# Patient Record
Sex: Female | Born: 1965 | Race: White | Hispanic: No | Marital: Married | Smoking: Former smoker
Health system: Southern US, Community
[De-identification: ages and names within clinical notes are randomized; demographics above are authoritative.]

## PROBLEM LIST (undated history)

## (undated) DIAGNOSIS — F419 Anxiety disorder, unspecified: Secondary | ICD-10-CM

## (undated) HISTORY — DX: Anxiety disorder, unspecified: F41.9

---

## 2015-12-01 ENCOUNTER — Ambulatory Visit (INDEPENDENT_AMBULATORY_CARE_PROVIDER_SITE_OTHER): Payer: BLUE CROSS/BLUE SHIELD | Admitting: Physician Assistant

## 2015-12-01 ENCOUNTER — Encounter: Payer: Self-pay | Admitting: Physician Assistant

## 2015-12-01 VITALS — BP 123/84 | HR 81 | Temp 98.2°F | Ht 66.0 in | Wt 185.0 lb

## 2015-12-01 DIAGNOSIS — F329 Major depressive disorder, single episode, unspecified: Secondary | ICD-10-CM

## 2015-12-01 DIAGNOSIS — R635 Abnormal weight gain: Secondary | ICD-10-CM

## 2015-12-01 DIAGNOSIS — F32A Depression, unspecified: Secondary | ICD-10-CM

## 2015-12-01 DIAGNOSIS — G4700 Insomnia, unspecified: Secondary | ICD-10-CM | POA: Diagnosis not present

## 2015-12-01 DIAGNOSIS — F419 Anxiety disorder, unspecified: Secondary | ICD-10-CM

## 2015-12-01 DIAGNOSIS — E663 Overweight: Secondary | ICD-10-CM

## 2015-12-01 MED ORDER — TRAZODONE HCL 50 MG PO TABS: 25.0000 mg | ORAL_TABLET | Freq: Every evening | ORAL | Status: DC | PRN

## 2015-12-01 MED ORDER — LORAZEPAM 0.5 MG PO TABS: ORAL_TABLET | ORAL | Status: DC

## 2015-12-01 MED ORDER — PHENTERMINE HCL 37.5 MG PO TABS: 37.5000 mg | ORAL_TABLET | Freq: Every day | ORAL | Status: DC

## 2015-12-01 MED ORDER — ESCITALOPRAM OXALATE 20 MG PO TABS: 20.0000 mg | ORAL_TABLET | Freq: Every day | ORAL | Status: DC

## 2015-12-02 DIAGNOSIS — F32A Depression, unspecified: Secondary | ICD-10-CM | POA: Insufficient documentation

## 2015-12-02 DIAGNOSIS — G4700 Insomnia, unspecified: Secondary | ICD-10-CM | POA: Insufficient documentation

## 2015-12-02 DIAGNOSIS — E663 Overweight: Secondary | ICD-10-CM | POA: Insufficient documentation

## 2015-12-02 DIAGNOSIS — F329 Major depressive disorder, single episode, unspecified: Secondary | ICD-10-CM | POA: Insufficient documentation

## 2015-12-02 DIAGNOSIS — F419 Anxiety disorder, unspecified: Secondary | ICD-10-CM | POA: Insufficient documentation

## 2015-12-02 NOTE — Progress Notes (Signed)
   Subjective:    Patient ID: Tonya Malone, female    DOB: [birth year], 50 y.o.   MRN: [id number]  HPI Pt is a 50 yo female who presents to the clinic to establish care.   .. Active Ambulatory Problems    Diagnosis Date Noted  . Overweight 12/02/2015  . Depression 12/02/2015  . Insomnia 12/02/2015  . Anxiety 12/02/2015   Resolved Ambulatory Problems    Diagnosis Date Noted  . No Resolved Ambulatory Problems   No Additional Past Medical History   .[name] Family History  Problem Relation Age of Onset  . Cancer Mother   . Cancer Maternal Grandmother    .[name] Social History   Social History  . Marital Status: Married    Spouse Name: N/A  . Number of Children: N/A  . Years of Education: N/A   Occupational History  . Not on file.   Social History Main Topics  . Smoking status: Current Some Day Smoker    Types: Cigarettes  . Smokeless tobacco: Not on file  . Alcohol Use: 0.0 oz/week    0 Standard drinks or equivalent per week  . Drug Use: No  . Sexual Activity: Yes   Other Topics Concern  . Not on file   Social History Narrative  . No narrative on file   She would like refills on medication. She was previously on lexapro  but since the death of her 76 yo daughter from drug overdose she increased to  and doing better. She occasionally uses ativan but lately using more. She is going through grief counseling for 12 weeks. She is exercing regularly but not losing any weight. She is also frustrated with that. Ativan helps her sleep as well. No sucidial thoughts.    Review of Systems  All other systems reviewed and are negative.      Objective:   Physical Exam  Constitutional: She is oriented to person, place, and time. She appears well-developed and well-nourished.  Overweight.   HENT:  Head: Normocephalic and atraumatic.  Cardiovascular: Normal rate, regular rhythm and normal heart sounds.   Pulmonary/Chest: Effort normal and breath sounds normal. She has  no wheezes.  Neurological: She is alert and oriented to person, place, and time.  Skin: Skin is dry.  Psychiatric: She has a normal mood and affect. Her behavior is normal.          Assessment & Plan:  Depression/anxiety- refilled  of lexapro. Ativan refilled. Continue Grief counseling.   Insomnia- trazodone at bedtime. SE's discussed.   Overweight/abnormal weight gain- started phentermine. Discussed side effects. Discussed to stop if making insomnia worse. Follow up in 1 month. Diet and exercise discussed.

## 2015-12-31 ENCOUNTER — Ambulatory Visit (INDEPENDENT_AMBULATORY_CARE_PROVIDER_SITE_OTHER): Payer: BLUE CROSS/BLUE SHIELD | Admitting: Physician Assistant

## 2015-12-31 ENCOUNTER — Encounter: Payer: Self-pay | Admitting: Physician Assistant

## 2015-12-31 VITALS — BP 136/84 | HR 88 | Ht 66.0 in | Wt 173.0 lb

## 2015-12-31 DIAGNOSIS — R635 Abnormal weight gain: Secondary | ICD-10-CM

## 2015-12-31 DIAGNOSIS — G4700 Insomnia, unspecified: Secondary | ICD-10-CM | POA: Diagnosis not present

## 2015-12-31 DIAGNOSIS — F32A Depression, unspecified: Secondary | ICD-10-CM

## 2015-12-31 DIAGNOSIS — F329 Major depressive disorder, single episode, unspecified: Secondary | ICD-10-CM

## 2015-12-31 MED ORDER — PHENTERMINE HCL 37.5 MG PO TABS: 37.5000 mg | ORAL_TABLET | Freq: Every day | ORAL | Status: DC

## 2015-12-31 MED ORDER — ESCITALOPRAM OXALATE 20 MG PO TABS: 20.0000 mg | ORAL_TABLET | Freq: Every day | ORAL | Status: DC

## 2015-12-31 NOTE — Progress Notes (Addendum)
   Subjective:    Patient ID: Tonya Malone, female    DOB: 10/25/1966, 50 y.o.   MRN: [id number]  HPI  Patient is a 50 year old female presenting to follow up for depression, sleep disturbance, weight loss and anxiety. Patient states that she is tolerating her medications well and does not want to make changes at this time. Patient has lost 12 pounds since 12/01/15 with phentermine use. Following the untimely death of her daughter, patient is attending grief counseling and taking her medications for anxiety and depression as directed. Patient states that she would like a refill of Lexapro so she does "not want to run out". Patient states that she has been trying to stay busy by going to the gym with friends and family. Patient denies suicidal/homicidal ideation.  She is sleeping great with trazodone.   Review of Systems   Please see HPI.  Objective:   Physical Exam  Constitutional: She is oriented to person, place, and time. She appears well-developed and well-nourished. No distress.  HENT:  Head: Normocephalic and atraumatic.  Nose: Nose normal.  Mouth/Throat: Oropharynx is clear and moist.  Patient's right external auditory canal is erythematous with injection along the tympanic membrane. There is a positive light reflex and no evidence of pus.   Patient's left tympanic membrane is pearly without evidence of pus.   Eyes: Conjunctivae and EOM are normal. Pupils are equal, round, and reactive to light.  Neck: Normal range of motion.  Cardiovascular: Normal rate, regular rhythm, normal heart sounds and intact distal pulses.  Exam reveals no friction rub.   No murmur heard. Pulmonary/Chest: Effort normal and breath sounds normal. No respiratory distress. She has no wheezes. She has no rales. She exhibits no tenderness.  Abdominal: Soft. Bowel sounds are normal. She exhibits no distension and no mass. There is no tenderness. There is no rebound and no guarding.  Musculoskeletal: Normal range  of motion.  Neurological: She is alert and oriented to person, place, and time.  Skin: Skin is warm and dry. She is not diaphoretic.  Psychiatric: She has a normal mood and affect. Her behavior is normal. Judgment and thought content normal.      Assessment & Plan:   1. Abnormal Weight Gain Patient will continue taking phentermine. Discussed diet and exercise. Follow up nurse visit in 1 month.   2. Depression/Anxiety Patient's Lexapro was refilled. Patient will continue taking Ativan as needed for anxiety. Doing great.   3. Sleep disturbance Patient will continue taking Trazodone. Doing well.

## 2016-01-01 ENCOUNTER — Other Ambulatory Visit: Payer: Self-pay | Admitting: Physician Assistant

## 2016-01-19 ENCOUNTER — Ambulatory Visit (INDEPENDENT_AMBULATORY_CARE_PROVIDER_SITE_OTHER): Payer: BLUE CROSS/BLUE SHIELD | Admitting: Osteopathic Medicine

## 2016-01-19 VITALS — BP 127/81 | HR 69 | Temp 98.6°F | Ht 66.0 in | Wt 175.0 lb

## 2016-01-19 DIAGNOSIS — R69 Illness, unspecified: Principal | ICD-10-CM

## 2016-01-19 DIAGNOSIS — J111 Influenza due to unidentified influenza virus with other respiratory manifestations: Secondary | ICD-10-CM | POA: Diagnosis not present

## 2016-01-19 LAB — POCT INFLUENZA A/B
INFLUENZA A, POC: NEGATIVE
INFLUENZA A, POC: POSITIVE — AB
INFLUENZA B, POC: NEGATIVE
Influenza B, POC: NEGATIVE

## 2016-01-19 MED ORDER — OSELTAMIVIR PHOSPHATE 75 MG PO CAPS: 75.0000 mg | ORAL_CAPSULE | Freq: Two times a day (BID) | ORAL | Status: DC

## 2016-01-19 NOTE — Patient Instructions (Signed)
DR. Mard[facility] INSTRUCTIONS: VIRAL ILLNESSES - ACHES/PAINS, SINUSITIS, COUGH, GASTRITIS   FRIST, A FEW NOTES ON OVER-THE-COUNTER MEDICATIONS!  . USE CAUTION - MANY OVER-THE-COUNTER MEDICATIONS COME IN COMBINATIONS OF MULTIPLE GENERIC MEDICINES. FOR INSTANCE, NYQUIL HAS TYLENOL + COUGH MEDICINE + AN ANTIHISTAMINE, SO BE CAREFUL YOU'RE NOT TAKING A COMBINATION MEDICINE WHICH CONTAINS MEDICATIONS YOU'RE ALSO TAKING SEPARATELY (LIKE NYQUIL SYRUP PLUS TYLENOL PILLS).  [name]. YOUR PHARMACIST CAN HELP YOU AVOID MEDICATION INTERACTIONS AND DUPLICATIONS - ASK FOR THEIR HELP IF YOU ARE CONFUSED OR UNSURE ABOUT WHAT TO PURCHASE OVER-THE-COUNTER!  . REMEMBER - IF YOU'RE EVER CONCERNED ABOUT MEDICATION SIDE EFFECTS, OR IF YOU'RE EVER CONCERNED YOUR SYMPTOMS ARE GETTING WORSE DESPITE TREATMENT, PLEASE CALL THE DOCTOR'S OFFICE! IF AFTER-HOURS, YOU CAN BE SEEN IN URGENT CARE OR, FOR SEVERE ILLNESS, PLEASE GO TO THE EMERGENCY ROOM.    TREAT SINUS CONGESTION, RUNNY NOSE & POSTNASAL DRIP: . Treat to increase airflow through sinuses, decrease congestion pain and prevent bacterial growth!  . Remember, only 0.5-2% of sinus infections are due to a bacteria, the rest are due to a virus (usually the common cold) which will not get better with antibiotics!    NASAL SPRAYS: generally safe and should not interact with other medicines. Can take any of these medications, either alone or together... FLONASE (FLUTICASONE) - 2 sprays in each nostril twice per day (also a great allergy medicine to use long-term!) AFRIN (OXYMETOLAZONE) - use sparingly because it will cause rebound congestion, NEVER USE IN KIDS   SALINE NASAL SPRAY- no limit, but avoid use after other nasal sprays or it can wash the medicine away PRESCRTIPTION ATROVENT - as directed on prescription bottle  ANTIHISTAMINES: Helps dry out runny nose and decreases postnasal drip. Benadryl may cause drowsiness but other preparations should not be as sedating.  Certain kinds are not as safe in elderly individuals. OK to use unless Dr A says otherwise.  Only use one of the following... BENADRYL (DIPHENHYDRAMINE) - 25-50 mg every 6 hours ZYRTEC (CETIRIZINE) - 5-10 mg daily CLARITIN (LORATIDINE) - less potent. 10 mg daily ALLEGRA (FEXOFENADINE) - least likely to cause drowsiness! 180 mg daily or 60 mg twice per day  DECONGESTANTS: Helps dry out runny nose and helps with sinus pain. May cause insomnia, or sometimes elevated heart rate. Can cause problems if used often in people with high blood pressure. OK to use unless Dr A says otherwise. NEVER USE IN KIDS UNDER 697 YEARS OLD. Only use one of the following... SUDAFED (PSEUDOEPHEDINE) - 60 mg every 4 - 6 hours, also comes in 120 mg extended release every 12 hours, maximum 240 mg in 24 hours.  SUDAED PE (PHENYLEPHRINE) - 10 mg every 4 - 6 hours, maximum 60 mg per day  COMBINATIONS OF ABOVE ANTIHISTAMINES + DECONGESTANTS: these usually require you to show your ID at the pharmacy counter. You can also purchase these medicines separately as noted above.  Only use one of the following... ZYRTEC-D (CETIRIZINE + PSEUDOEPHEDRINE) - 12 hour formulation as directed CLARITIN-D (LORATIDINE + PSEUDOEPHEDRINE) - 12 and 24 hour formulations as directed ALLEGRA-D (FEXOFENADINE + PSEUDOEPHEDRINE) - 12 and 24 hour formulations as directed  PRESCRIPTION TREATMENT FOR SINUS PROBLEMS: Can include nasal sprays, pills, or antibiotics in the case of true bacterial infection. Not everyone needs an antibiotic but there are other medicines which will help you feel better while your body fights the infection!    TREAT COUGH & SORE THROAT: . Remember, cough is the body's way of  protecting your airways and lungs, it's a hard-wired reflex that is tough for medicines to treat!  . Irritation to the airways will cause cough. This irritation is usually caused by upper airway problems like postnasal drip (treat as above) and viral sore  throat, but in severe cases can be due to lower airway problems like bronchitis or pneumonia, which a doctor can usually hear on exam of your lungs or see on an X-ray. . Sore throat is almost always due to a virus, but occasionally caused by certain strains of Strep, which requires antibiotics.  . Exercise and smoking may make cough worse - take it easy while you're sick, and QUIT SMOKING!   . Cough due to viral infection can linger for 2-3 weeks or so. If you're coughing longer than you think you should, or if the cough is severe, please make an appointment in the office - you may need a chest X-ray.   EXPECTORANT: Used to help clear airways, take these with PLENTY of water to help thin mucus secretions and make the mucus easier to cough up   Only use one of the following... ROBITUSSIN (DEXTROMETHORPHAN OR GUAIFENISEN depending on formulation)  MUCINEX (GUAIFENICEN) - usually longer acting  COUGH DROPS/LOZENGES: Whichever over-the-counter agent you prefer!  Here are some suggestions for ingredients to look for (can take both)... BENZOCAINE - numbing effect, also in CHLORASEPTIC THROAT SPRAY MENTHOL - cooling effect  HONEY: has gone head-to-head in several clinical trials with prescription cough medicines and found to be equally effective! Try 1 Teaspoon Honey + 2 Drops Lemon Juice, as much as you want to use. NONE FOR KIDS UNDER AGE 52  HERBAL TEA: There are certain ingredients which help "coat the throat" to relieve pain  such as ELM BARK, LICORICE ROOT, MARSHMALLOW ROOT  PRESCRIPTION TREATMENT FOR COUGH: Reserved for severe cases. Can include pills, syrups or inhalers.    TREAT ACHES & PAINS, FEVER: . Illness causes aches, pains and fever as your body increases its natural inflammation response to help fight the infection.  . Rest, good hydration and nutrition, and taking anti-inflammatory medications will help.  . Remember: a true fever is a temperature 100.4 or higher. If you have a  fever that is 105.0 or higher, that is a dangerous level and needs medical attention in the office or in the ER!    Can take both of these together if it's ok with your doctor... IBUPROFEN - 400-800 mg every 6 - 8 hours. Ibuprofen and similar medications can cause problems for people with heart disease or history of stomach ulcers, check with Dr A first if you're concerned. Lower doses are usually safe and effective.  TYLENOL (ACETAMINOPHEN) - [phone] mg every 6 hours. It won't cause problems with heart or stomach.     TREAT GASTROINTESTINAL SYMPTOMS: . Hydrate, hydrate, hydrate! Try drinking Gatorade/Powerade, broth/soup. Avoid milk and juice, these can make diarrhea worse. You can drink water, of course, but if you are also having vomiting and loose stool you are losing electrolytes which water alone can't replace.   IMMODIUM (LOPERAMIDE) - as directed on the bottle to help with loose stool PRESCRIPTION ZOFRAN (ONDANSETRON) OR PHENERGAN (PROMETHAZINE) - as directed to help nausea and vomiting. Try taking these before you eat if you are having trouble keeping food down.     REMEMBER - THE MOST IMPORTANT THINGS YOU CAN DO TO AVOID CATCHING OR SPREADING ILLNESS INCLUDE:  . COVER YOUR COUGH WITH YOUR ARM, NOT WITH YOUR HANDS!  [name]  DISINFECT COMMONLY USED SURFACES (SUCH AS TELEPHONES & DOORKNOBS) WHEN YOU OR SOMEONE CLOSE TO YOU IS FEELING SICK!  . BE SURE VACCINES ARE UP TO DATE - GET A FLU SHOT EVERY YEAR! THE FLU CAN BE DEADLY FOR BABIES, ELDERLY FOLKS, AND PEOPLE WITH WEAK IMMUNE SYSTEMS - YOU SHOULD BE VACCINATED TO HELP PREVENT YOURSELF FROM GETTING SICK, BUT ALSO TO PREVENT SOMEONE ELSE FROM GETTING AN INFECTION WHICH MAY HOSPITALIZE OR KILL THEM.  [name] GOOD NUTRITION AND HEALTHY LIFESTYLE WILL HELP YOUR IMMUNE SYSTEM YEAR-ROUND! THERE IS NO MAGIC SUPPLEMENT!  . AND ABOVE ALL - WASH YOUR HANDS OFTEN!

## 2016-01-19 NOTE — Progress Notes (Signed)
HPI: Tonya Malone is a 50 y.o. female who [facility] [place]  today for chief complaint of:  Chief Complaint  Patient presents with  . Generalized Body Aches  . Fatigue   . Quality: aches, dry cough . Assoc signs/symptoms: see ROS . Duration: 2 days . Modifying factors: has tried the following OTC/Rx medications: OTC severe flu and cold, motrin  . Context:    Past medical, social and family history reviewed. Current medications and allergies reviewed.     Review of Systems: CONSTITUTIONAL: subjective fever/chills HEAD/EYES/EARS/NOSE/THROAT: yes headache, no vision change or hearing change, mild sore throat CARDIAC: No chest pain/pressure/palpitations RESPIRATORY: yes cough, no shortness of breath GASTROINTESTINAL: had some but now resolved nausea, no vomiting, no abdominal pain, no diarrhea MUSCULOSKELETAL: yes myalgia/arthralgia   Exam:  BP 127/81 mmHg  Pulse 69  Temp(Src) 98.6 F (37 C) (Oral)  Ht 5\' 6"  (1.676 m)  Wt 175 lb (79.379 kg)  BMI 28.26 kg/m2 Constitutional: VSS, see above. General Appearance: alert, well-developed, well-nourished, NAD Eyes: Normal lids and conjunctive, non-icteric sclera Ears, Nose, Mouth, Throat: Normal external inspection ears/nares/mouth/lips/gums, normal TM, MMM;       posterior pharynx with erythema, without exudate Neck: No masses, trachea midline. normal lymph nodes Respiratory: Normal respiratory effort. No  wheeze/rhonchi/rales Cardiovascular: S1/S2 normal, no murmur/rub/gallop auscultated. RRR.   Results for orders placed or performed in visit on 01/19/16 (from the past 72 hour(s))  POCT Influenza A/B     Status: None   Collection Time: 01/19/16  9:41 AM  Result Value Ref Range   Influenza A, POC Negative Negative   Influenza B, POC Negative Negative  POCT Influenza A/B     Status: Abnormal   Collection Time: 01/19/16 10:26 AM  Result Value Ref Range   Influenza A, POC Positive (A)  Negative   Influenza B, POC Negative Negative   No results found.   ASSESSMENT/PLAN: OTC supportive care reviewed as well. Note: initial test results for flu entered incorrectly  Influenza-like illness - Plan: POCT Influenza A/B, oseltamivir (TAMIFLU) 75 MG capsule    Return if symptoms worsen or fail to improve.

## 2016-02-25 ENCOUNTER — Ambulatory Visit (INDEPENDENT_AMBULATORY_CARE_PROVIDER_SITE_OTHER): Payer: BLUE CROSS/BLUE SHIELD | Admitting: Physician Assistant

## 2016-02-25 VITALS — BP 114/76 | HR 76 | Wt 166.0 lb

## 2016-02-25 DIAGNOSIS — R635 Abnormal weight gain: Secondary | ICD-10-CM | POA: Diagnosis not present

## 2016-02-25 MED ORDER — PHENTERMINE HCL 37.5 MG PO TABS: 37.5000 mg | ORAL_TABLET | Freq: Every day | ORAL | Status: DC

## 2016-02-25 NOTE — Progress Notes (Signed)
Patient is here for blood pressure and weight check. Denies any trouble sleeping, palpitations, or any other medication problems. Patient does report she has been taking the Phentermine every other day. Patient has lost weight. A refill for Phentermine will be sent to patient preferred pharmacy. Patient advised to schedule a four week nurse visit and keep her upcoming appointment with her PCP. Verbalized understanding, no further questions.

## 2016-03-09 ENCOUNTER — Other Ambulatory Visit: Payer: Self-pay | Admitting: *Deleted

## 2016-03-09 MED ORDER — TRAZODONE HCL 50 MG PO TABS: 25.0000 mg | ORAL_TABLET | Freq: Every evening | ORAL | Status: DC | PRN

## 2016-03-31 ENCOUNTER — Ambulatory Visit (INDEPENDENT_AMBULATORY_CARE_PROVIDER_SITE_OTHER): Payer: BLUE CROSS/BLUE SHIELD | Admitting: Physician Assistant

## 2016-03-31 VITALS — BP 121/79 | HR 81 | Wt 165.0 lb

## 2016-03-31 DIAGNOSIS — R635 Abnormal weight gain: Secondary | ICD-10-CM

## 2016-03-31 MED ORDER — PHENTERMINE HCL 37.5 MG PO TABS: 37.5000 mg | ORAL_TABLET | Freq: Every day | ORAL | Status: DC

## 2016-03-31 NOTE — Progress Notes (Signed)
Patient is here for blood pressure and weight check. Denies any trouble sleeping, palpitations, or any other medication problems. Patient reports taking her Phentermine every other day and reports she has been "busy with work" so she hasn't been in the gym "as much." Patient has lost one pound since last OV, advised Pt to strive for at least 4 pounds weight loss per month. A refill for Phentermine will be sent to patient preferred pharmacy. Patient advised to schedule a four week nurse visit and keep her upcoming appointment with her PCP. Verbalized understanding, no further questions.

## 2016-05-31 ENCOUNTER — Other Ambulatory Visit: Payer: Self-pay | Admitting: *Deleted

## 2016-05-31 MED ORDER — ESCITALOPRAM OXALATE 20 MG PO TABS
20.0000 mg | ORAL_TABLET | Freq: Every day | ORAL | 0 refills | Status: DC
Start: 2016-05-31 — End: 2016-09-19

## 2016-07-11 ENCOUNTER — Encounter: Payer: Self-pay | Admitting: Emergency Medicine

## 2016-07-11 ENCOUNTER — Emergency Department (INDEPENDENT_AMBULATORY_CARE_PROVIDER_SITE_OTHER): Payer: BLUE CROSS/BLUE SHIELD

## 2016-07-11 ENCOUNTER — Emergency Department
Admission: EM | Admit: 2016-07-11 | Discharge: 2016-07-11 | Disposition: A | Discharge status: Home / Self Care | Payer: BLUE CROSS/BLUE SHIELD | Source: Home / Self Care | Attending: Family Medicine | Admitting: Family Medicine

## 2016-07-11 DIAGNOSIS — J189 Pneumonia, unspecified organism: Secondary | ICD-10-CM

## 2016-07-11 DIAGNOSIS — R05 Cough: Secondary | ICD-10-CM | POA: Diagnosis not present

## 2016-07-11 DIAGNOSIS — J181 Lobar pneumonia, unspecified organism: Principal | ICD-10-CM

## 2016-07-11 LAB — POCT CBC W AUTO DIFF (K'VILLE URGENT CARE)
HEMOGLOBIN (KUC): 14.6 g/dL (ref 11.8–15.5)
Hematocrit: 42.7 % (ref 34.8–46)
LYMPHOCYTES RELATIVE % (KUC): 14.4 % — AB (ref 15–45)
Lymphocytes absolute: 2.3 10*3/uL — AB (ref 0.1–1.8)
MCH: 31.7 pg (ref 26–32)
MCHC (KUC): 34.2 g/dL (ref 32–36.5)
MCV: 92.6 fL (ref 78–100)
MPV (KUC): 6 fL — AB (ref 7.8–11)
Monocyes absolute: 0.6 10*3/uL (ref 0.1–1)
Monocytes relative %: 3.7 % (ref 2–10)
Neutrophils absolute (GR#): 12.9 10*3/uL — AB (ref 1.7–7.8)
Neutrophils relative % (GR): 81.9 % — AB (ref 44–76)
PLATELET COUNT (KUC): 484 10*3/uL — AB (ref 140–400)
RBC (KUC): 4.61 MIL/uL (ref 3.8–5.1)
RDW (KUC): 12.3 % (ref 11.6–14)
WBC (KUC): 15.7 10*3/uL — AB (ref 4.5–10.5)

## 2016-07-11 LAB — POCT URINALYSIS DIP (MANUAL ENTRY)
Glucose, UA: NEGATIVE
Leukocytes, UA: NEGATIVE
Nitrite, UA: NEGATIVE
PH UA: 6
Protein Ur, POC: 30 — AB
RBC UA: NEGATIVE
SPEC GRAV UA: 1.02
Urobilinogen, UA: 4

## 2016-07-11 MED ORDER — CEFTRIAXONE SODIUM 1 G IJ SOLR
1000.0000 mg | Freq: Once | INTRAMUSCULAR | Status: AC
  Administered 2016-07-11: 1000 mg via INTRAMUSCULAR

## 2016-07-11 MED ORDER — BENZONATATE 200 MG PO CAPS: 200.0000 mg | ORAL_CAPSULE | Freq: Every day | ORAL | 0 refills | Status: DC

## 2016-07-11 MED ORDER — CEFDINIR 300 MG PO CAPS: 300.0000 mg | ORAL_CAPSULE | Freq: Two times a day (BID) | ORAL | 0 refills | Status: DC

## 2016-07-11 NOTE — ED Triage Notes (Signed)
Pt c/o body aches, fever, rash x 3 days that is itching, no change in detergent, cough, chills.

## 2016-07-11 NOTE — ED Provider Notes (Signed)
[facility]    CSN: [id number] Arrival date & time: 07/11/16  1219  First Provider Contact:  First MD Initiated Contact with Patient 07/11/16 1320        History   Chief Complaint Chief Complaint  Patient presents with  . Fever    HPI Tonya Malone is a 50 y.o. female.   Patient complains of onset of a pruritic rash on her legs, and some on her face about 5 days ago.  Four days ago she developed fever/chills, myalgias, and fatigue. She reports that about two weeks ago while swimming at [facility] she may have aspirated some sea water.  She subsequently developed a low grade fever and has had a persistent non-productive cough since then.   She continues to smoke one pack per day.   The history is provided by the patient.    History reviewed. No pertinent past medical history.  Patient Active Problem List   Diagnosis Date Noted  . Overweight 12/02/2015  . Depression 12/02/2015  . Insomnia 12/02/2015  . Anxiety 12/02/2015    History reviewed. No pertinent surgical history.  OB History    No data available       Home Medications    Prior to Admission medications   Medication Sig Start Date End Date Taking? Authorizing Provider  benzonatate (TESSALON) 200 MG capsule Take 1 capsule (200 mg total) by mouth at bedtime. Take as needed for cough 07/11/16   Lattie Haw, MD  cefdinir (OMNICEF) 300 MG capsule Take 1 capsule (300 mg total) by mouth 2 (two) times daily. 07/11/16   Lattie Haw, MD  escitalopram (LEXAPRO) 20 MG tablet Take 1 tablet (20 mg total) by mouth daily. 05/31/16   Jade L Breeback, PA-C  traZODone (DESYREL) 50 MG tablet Take 0.5-1 tablets (25-50 mg total) by mouth at bedtime as needed for sleep. 03/09/16   Jomarie Longs, PA-C    Family History Family History  Problem Relation Age of Onset  . Cancer Mother   . Cancer Maternal Grandmother     Social History Social History  Substance Use Topics  . Smoking status: Current Some  Day Smoker    Types: Cigarettes  . Smokeless tobacco: Never Used  . Alcohol use 0.0 oz/week     Allergies   Review of patient's allergies indicates no known allergies.   Review of Systems Review of Systems No sore throat + cough No pleuritic pain No wheezing No nasal congestion No post-nasal drainage No sinus pain/pressure No itchy/red eyes No earache No hemoptysis No SOB + fever, + chills No nausea No vomiting No abdominal pain No diarrhea No urinary symptoms + skin rash + fatigue + myalgias + headache Used OTC meds without relief   Physical Exam Triage Vital Signs ED Triage Vitals  Enc Vitals Group     BP 07/11/16 1242 111/71     Pulse Rate 07/11/16 1242 82     Resp --      Temp 07/11/16 1242 98.2 F (36.8 C)     Temp Source 07/11/16 1242 Oral     SpO2 07/11/16 1242 96 %     Weight 07/11/16 1242 158 lb 4 oz (71.8 kg)     Height 07/11/16 1242 5\' 6"  (1.676 m)     Head Circumference --      Peak Flow --      Pain Score 07/11/16 1244 6     Pain Loc --      Pain  Edu? --      Excl. in GC? --    No data found.   Updated Vital Signs BP 111/71 (BP Location: Left Arm)   Pulse 82   Temp 98.2 F (36.8 C) (Oral)   Ht 5\' 6"  (1.676 m)   Wt 158 lb 4 oz (71.8 kg)   LMP  (LMP Unknown) Comment: 2 years ago  SpO2 96%   BMI 25.54 kg/m   Visual Acuity Right Eye Distance:   Left Eye Distance:   Bilateral Distance:    Right Eye Near:   Left Eye Near:    Bilateral Near:     Physical Exam  Constitutional: She is oriented to person, place, and time. She appears well-developed and well-nourished. No distress.  HENT:  Head: Normocephalic.    Right Ear: Tympanic membrane, external ear and ear canal normal.  Left Ear: Tympanic membrane, external ear and ear canal normal.  Nose: Nose normal.  Mouth/Throat: Oropharynx is clear and moist.  Eyes: Conjunctivae and EOM are normal. Pupils are equal, round, and reactive to light.  Neck: Neck supple.    Cardiovascular: Normal heart sounds.   Pulmonary/Chest: Effort normal and breath sounds normal.  Abdominal: There is no tenderness.  Musculoskeletal: She exhibits no edema.  Lymphadenopathy:    She has no cervical adenopathy.  Neurological: She is alert and oriented to person, place, and time.  Skin: Skin is warm and dry. Rash noted.     There is a faint erythematous non-specific macular eruption on patient's legs and face as noted on diagram.   Nursing note and vitals reviewed.    UC Treatments / Results  Labs (all labs ordered are listed, but only abnormal results are displayed) Labs Reviewed  POCT CBC W AUTO DIFF ([facility]) - Abnormal; Notable for the following:       Result Value   WBC 15.7 (*)    Lymphocytes relative % 14.4 (*)    Neutrophils relative % (GR) 81.9 (*)    Lymphocytes absolute 2.3 (*)    Neutrophils absolute (GR#) 12.9 (*)    Platelet count 484 (*)    MPV 6.0 (*)    All other components within normal limits  POCT URINALYSIS DIP (MANUAL ENTRY) - Abnormal; Notable for the following:    Color, UA other (*)    Bilirubin, UA small (*)    Ketones, POC UA small (15) (*)    Protein Ur, POC =30 (*)    All other components within normal limits  COMPLETE METABOLIC PANEL WITH GFR  SEDIMENTATION RATE    EKG  EKG Interpretation None       Radiology Dg Chest 2 View  Result Date: 07/11/2016 CLINICAL DATA:  Persistent cough for 2 weeks. EXAM: CHEST  2 VIEW COMPARISON:  None. FINDINGS: The heart size and mediastinal contours are within normal limits. There is patchy consolidation of right lung base. The left lung is clear. There is no pleural effusion or pulmonary edema. The visualized skeletal structures are unremarkable. IMPRESSION: Pneumonia right lung base. Electronically Signed   By: Sherian ReinWei-Chen  Lin M.D.   On: 07/11/2016 14:00    Procedures Procedures (including critical care time)  Medications Ordered in UC Medications  cefTRIAXone (ROCEPHIN)  injection 1,000 mg (not administered)     Initial Impression / Assessment and Plan / UC Course  I have reviewed the triage vital signs and the nursing notes.  Pertinent labs & imaging results that were available during my care of the patient  were reviewed by me and considered in my medical decision making (see chart for details).  Clinical Course  Concern for possible aspiration pneumonia (recent seawater exposure) Administered Rocephin 1gm IM.  Will begin cephalosporin to minimize possible interactions with Lexapro and Desyrel. Tomorrow begin Omnicef 300mg  BID Prescription written for Benzonatate (Tessalon) to take at bedtime for night-time cough.  Take plain guaifenesin (1200mg  extended release tabs such as Mucinex) twice daily, with plenty of water, for cough and congestion.   May take Tylenol as needed for fever. Stop all antihistamines for now, and other non-prescription cough/cold preparations. If symptoms become significantly worse during the night or over the weekend, proceed to the local emergency room.  Followup with Family Doctor in 4 days. Recommend repeat chest X-ray in about 12 to 14 days. Recommend discontinuing smoking.     Final Clinical Impressions(s) / UC Diagnoses   Final diagnoses:  Right lower lobe pneumonia    New Prescriptions New Prescriptions   BENZONATATE (TESSALON) 200 MG CAPSULE    Take 1 capsule (200 mg total) by mouth at bedtime. Take as needed for cough   CEFDINIR (OMNICEF) 300 MG CAPSULE    Take 1 capsule (300 mg total) by mouth 2 (two) times daily.     Lattie Haw, MD 07/21/16 [phone]

## 2016-07-11 NOTE — Discharge Instructions (Signed)
Take plain guaifenesin (1200mg  extended release tabs such as Mucinex) twice daily, with plenty of water, for cough and congestion.   May take Tylenol as needed for fever. Stop all antihistamines for now, and other non-prescription cough/cold preparations. If symptoms become significantly worse during the night or over the weekend, proceed to the local emergency room.

## 2016-07-12 DIAGNOSIS — J189 Pneumonia, unspecified organism: Secondary | ICD-10-CM | POA: Diagnosis not present

## 2016-07-13 LAB — COMPLETE METABOLIC PANEL WITH GFR
ALT: 17 U/L (ref 6–29)
AST: 13 U/L (ref 10–35)
Albumin: 4.2 g/dL (ref 3.6–5.1)
Alkaline Phosphatase: 62 U/L (ref 33–115)
BUN: 12 mg/dL (ref 7–25)
CALCIUM: 9.9 mg/dL (ref 8.6–10.2)
CHLORIDE: 101 mmol/L (ref 98–110)
CO2: 29 mmol/L (ref 20–31)
CREATININE: 0.82 mg/dL (ref 0.50–1.10)
GFR, EST NON AFRICAN AMERICAN: 84 mL/min (ref >=60)
Glucose, Bld: 92 mg/dL (ref 65–99)
POTASSIUM: 5 mmol/L (ref 3.5–5.3)
Sodium: 137 mmol/L (ref 135–146)
Total Bilirubin: 0.5 mg/dL (ref 0.2–1.2)
Total Protein: 7.5 g/dL (ref 6.1–8.1)

## 2016-07-13 LAB — SEDIMENTATION RATE: SED RATE: 5 mm/h (ref 0–20)

## 2016-07-14 ENCOUNTER — Ambulatory Visit: Payer: BLUE CROSS/BLUE SHIELD | Admitting: Physician Assistant

## 2016-07-14 ENCOUNTER — Ambulatory Visit (INDEPENDENT_AMBULATORY_CARE_PROVIDER_SITE_OTHER): Payer: BLUE CROSS/BLUE SHIELD | Admitting: Family Medicine

## 2016-07-14 ENCOUNTER — Encounter: Payer: Self-pay | Admitting: Family Medicine

## 2016-07-14 VITALS — BP 117/79 | HR 80 | Temp 98.0°F | Resp 18 | Wt 158.1 lb

## 2016-07-14 DIAGNOSIS — Z720 Tobacco use: Secondary | ICD-10-CM

## 2016-07-14 DIAGNOSIS — L509 Urticaria, unspecified: Secondary | ICD-10-CM | POA: Diagnosis not present

## 2016-07-14 DIAGNOSIS — J189 Pneumonia, unspecified organism: Secondary | ICD-10-CM | POA: Diagnosis not present

## 2016-07-14 DIAGNOSIS — F17200 Nicotine dependence, unspecified, uncomplicated: Secondary | ICD-10-CM | POA: Insufficient documentation

## 2016-07-14 MED ORDER — AZITHROMYCIN 250 MG PO TABS: 250.0000 mg | ORAL_TABLET | Freq: Every day | ORAL | 0 refills | Status: DC

## 2016-07-14 NOTE — Patient Instructions (Signed)
Thank you for coming in today. Continue omnicef.  Add azithromycin.  Use certizine (zyrtec) daily for hives.  Use gold bond itch as needed for hives.  Return in 3 weeks if not 100% better.  Call or go to the emergency room if you get worse, have trouble breathing, have chest pains, or palpitations.    Aspiration Pneumonia Aspiration pneumonia is an infection in your lungs. It occurs when food, liquid, or stomach contents (vomit) are inhaled (aspirated) into your lungs. When these things get into your lungs, swelling (inflammation) and infection can occur. This can make it difficult for you to breathe. Aspiration pneumonia is a serious condition and can be life threatening. RISK FACTORS Aspiration pneumonia is more likely to occur when a person's cough (gag) reflex or ability to swallow has been decreased. Some things that can do this include:   Having a brain injury or disease, such as stroke, seizures, Parkinson's disease, dementia, or amyotrophic lateral sclerosis (ALS).   Being given general anesthetic for procedures.   Being in a coma (unconscious).   Having a narrowing of the tube that carries food to the stomach (esophagus).   Drinking too much alcohol. If a person passes out and vomits, vomit can be swallowed into the lungs.   Taking certain medicines, such as tranquilizers or sedatives.  SIGNS AND SYMPTOMS   Coughing after swallowing food or liquids.   Breathing problems, such as wheezing or shortness of breath.   Bluish skin. This can be caused by lack of oxygen.   Coughing up food or mucus. The mucus might contain blood, greenish material, or yellowish-white fluid (pus).   Fever.   Chest pain.   Being more tired than usual (fatigue).   Sweating more than usual.   Bad breath.  DIAGNOSIS  A physical exam will be done. During the exam, the health care provider will listen to your lungs with a stethoscope to check for:   Crackling sounds in the  lungs.  Decreased breath sounds.  A rapid heartbeat. Various tests may be ordered. These may include:   Chest X-ray.   CT scan.   Swallowing study. This test looks at how food is swallowed and whether it goes into your breathing tube (trachea) or food pipe (esophagus).   Sputum culture. Saliva and mucus (sputum) are collected from the lungs or the tubes that carry air to the lungs (bronchi). The sputum is then tested for bacteria.   Bronchoscopy. This test uses a flexible tube (bronchoscope) to see inside the lungs. TREATMENT  Treatment will usually include antibiotic medicines. Other medicines may also be used to reduce fever or pain. You may need to be treated in the hospital. In the hospital, your breathing will be carefully monitored. Depending on how well you are breathing, you may need to be given oxygen, or you may need breathing support from a breathing machine (ventilator). For people who fail a swallowing study, a feeding tube might be placed in the stomach, or they may be asked to avoid certain food textures or liquids when they eat. HOME CARE INSTRUCTIONS   Carefully follow any special eating instructions you were given, such as avoiding certain food textures or thickening liquids. This reduces the risk of developing aspiration pneumonia again.  Only take over-the-counter or prescription medicines as directed by your health care provider. Follow the directions carefully.   If you were prescribed antibiotics, take them as directed. Finish them even if you start to feel better.   Rest as  instructed by your health care provider.   Keep all follow-up appointments with your health care provider.  SEEK MEDICAL CARE IF:   You develop worsening shortness of breath, wheezing, or difficulty breathing.   You develop a fever.   You have chest pain.  MAKE SURE YOU:   Understand these instructions.  Will watch your condition.  Will get help right away if you are  not doing well or get worse.   This information is not intended to replace advice given to you by your health care provider. Make sure you discuss any questions you have with your health care provider.   Document Released: 08/22/2009 Document Revised: 10/30/2013 Document Reviewed: 04/12/2013 Elsevier Interactive Patient Education 2016 Elsevier Inc.    Hives Hives are itchy, red, swollen areas of the skin. They can vary in size and location on your body. Hives can come and go for hours or several days (acute hives) or for several weeks (chronic hives). Hives do not spread from person to person (noncontagious). They may get worse with scratching, exercise, and emotional stress. CAUSES   Allergic reaction to food, additives, or drugs.  Infections, including the common cold.  Illness, such as vasculitis, lupus, or thyroid disease.  Exposure to sunlight, heat, or cold.  Exercise.  Stress.  Contact with chemicals. SYMPTOMS   Red or white swollen patches on the skin. The patches may change size, shape, and location quickly and repeatedly.  Itching.  Swelling of the hands, feet, and face. This may occur if hives develop deeper in the skin. DIAGNOSIS  Your caregiver can usually tell what is wrong by performing a physical exam. Skin or blood tests may also be done to determine the cause of your hives. In some cases, the cause cannot be determined. TREATMENT  Mild cases usually get better with medicines such as antihistamines. Severe cases may require an emergency epinephrine injection. If the cause of your hives is known, treatment includes avoiding that trigger.  HOME CARE INSTRUCTIONS   Avoid causes that trigger your hives.  Take antihistamines as directed by your caregiver to reduce the severity of your hives. Non-sedating or low-sedating antihistamines are usually recommended. Do not drive while taking an antihistamine.  Take any other medicines prescribed for itching as  directed by your caregiver.  Wear loose-fitting clothing.  Keep all follow-up appointments as directed by your caregiver. SEEK MEDICAL CARE IF:   You have persistent or severe itching that is not relieved with medicine.  You have painful or swollen joints. SEEK IMMEDIATE MEDICAL CARE IF:   You have a fever.  Your tongue or lips are swollen.  You have trouble breathing or swallowing.  You feel tightness in the throat or chest.  You have abdominal pain. These problems may be the first sign of a life-threatening allergic reaction. Call your local emergency services (911 in [place] MAKE SURE YOU:   Understand these instructions.  Will watch your condition.  Will get help right away if you are not doing well or get worse.   This information is not intended to replace advice given to you by your health care provider. Make sure you discuss any questions you have with your health care provider.   Document Released: 10/25/2005 Document Revised: 10/30/2013 Document Reviewed: 01/18/2012 Elsevier Interactive Patient Education [employer]2016 Elsevier Inc.

## 2016-07-14 NOTE — Progress Notes (Signed)
Tonya Malone is a 50 y.o. female who presents to [facility] today for follow-up pneumonia and urticaria.  Pneumonia: Patient was seen in urgent care on September 3 were she was diagnosed with a right lower lung pneumonia. She was given a ceftriaxone injection and a prescription for Omnicef. She notes that she's not feeling much better. She continues to feel fatigued and have night sweats. She denies significant severe cough or shortness of breath or chest pains. She does note however that she aspirated a bit of seawater about 2 weeks ago.  Urticaria: Patient presented to urgent care not for pneumonia symptoms on the third but for recurrent episodes of urticaria. She notes episodes across different parts of her body that lasts for several minutes to hours. She has pictures that show urticaria. She's used some over-the-counter medicines including Benadryl which helped only a little. Aside from the night sweats she feels well with no chest pains vomiting diarrhea fevers or chills. She denies any new exposures to perfume soaps detergents shampoos or any new medications. She denies any history of similar episodes of urticaria. She is a current daily smoker.   No past medical history on file. No past surgical history on file. Social History  Substance Use Topics  . Smoking status: Current Some Day Smoker    Types: Cigarettes  . Smokeless tobacco: Never Used  . Alcohol use 0.0 oz/week   family history includes Cancer in her maternal grandmother and mother.  ROS as above:  Medications: Current Outpatient Prescriptions  Medication Sig Dispense Refill  . azithromycin (ZITHROMAX) 250 MG tablet Take 1 tablet (250 mg total) by mouth daily. Take first 2 tablets together, then 1 every day until finished. 6 tablet 0  . benzonatate (TESSALON) 200 MG capsule Take 1 capsule (200 mg  total) by mouth at bedtime. Take as needed for cough 12 capsule 0  . cefdinir (OMNICEF) 300 MG capsule Take 1 capsule (300 mg total) by mouth 2 (two) times daily. 20 capsule 0  . escitalopram (LEXAPRO) 20 MG tablet Take 1 tablet (20 mg total) by mouth daily. 90 tablet 0  . traZODone (DESYREL) 50 MG tablet Take 0.5-1 tablets (25-50 mg total) by mouth at bedtime as needed for sleep. 90 tablet 0   No current facility-administered medications for this visit.    No Known Allergies   Exam:  BP 117/79 (BP Location: Right Arm, Patient Position: Sitting, Cuff Size: Normal)   Pulse 80   Temp 98 F (36.7 C) (Oral)   Resp 18   Wt 158 lb 1.9 oz (71.7 kg)   LMP  (LMP Unknown) Comment: 2 years ago  SpO2 97%   BMI 25.52 kg/m  Gen: Well NAD Nontoxic appearing HEENT: EOMI,  MMM Lungs: Normal work of breathing. CTABL Heart: RRR no MRG Abd: NABS, Soft. Nondistended, Nontender Exts: Brisk capillary refill, warm and well perfused.  Skin: No rash  Dg Chest 2 View  Result Date: 07/11/2016 CLINICAL DATA:  Persistent cough for 2 weeks. EXAM: CHEST  2 VIEW COMPARISON:  None. FINDINGS: The heart size and mediastinal contours are within normal limits. There is patchy consolidation of right lung base. The left lung is clear. There is no pleural effusion or pulmonary edema. The visualized skeletal structures are unremarkable. IMPRESSION: Pneumonia right lung base. Electronically Signed   By: Sherian Rein M.D.   On: 07/11/2016 14:00      Chemistry  Component Value Date/Time   NA 137 07/13/2016 0002   K 5.0 07/13/2016 0002   CL 101 07/13/2016 0002   CO2 29 07/13/2016 0002   BUN 12 07/13/2016 0002   CREATININE 0.82 07/13/2016 0002      Component Value Date/Time   CALCIUM 9.9 07/13/2016 0002   ALKPHOS 62 07/13/2016 0002   AST 13 07/13/2016 0002   ALT 17 07/13/2016 0002   BILITOT 0.5 07/13/2016 0002        Assessment and Plan: 50 y.o. female with  Community-acquired pneumonia: Possibly  aspiration and poor however based on history of possibly aspirating some seawater. Clinically she is doing well but does continue to experience night sweats and feels fatigued. I think is reasonable to consider double coverage and including treatment with azithromycin to cover for atypicals. I recommend she follow-up with her PCP in about 3 weeks if still symptomatic or return sooner if needed.  Urticaria: Unclear etiology. This may be related to her pneumonia. Otherwise is not clear what's causing urticaria. Plan for watchful waiting and treatment with cetirizine daily and Gold Bond Itch as needed. The differential includes lung cancer as an etiology here. This would explain the pneumonia seen on x-ray as well as night sweats and potential immunologic dysregulation causing urticaria. I think this is very unlikely however if her symptoms continue and persist further laboratory evaluation and possibly a CT scan of her chest is a reasonable next step is still symptomatic in a few weeks. She is a current daily smoker.   No orders of the defined types were placed in this encounter.   Discussed warning signs or symptoms. Please see discharge instructions. Patient expresses understanding.  CC: Tandy GawJade Breeback, PA-C

## 2016-07-14 NOTE — Progress Notes (Signed)
Seen in UC Sunday and Dx with pneumonia.  Here for follow up.

## 2016-08-03 ENCOUNTER — Other Ambulatory Visit: Payer: Self-pay | Admitting: Physician Assistant

## 2016-09-19 ENCOUNTER — Other Ambulatory Visit: Payer: Self-pay | Admitting: Physician Assistant

## 2016-10-30 ENCOUNTER — Other Ambulatory Visit: Payer: Self-pay | Admitting: Physician Assistant

## 2016-12-21 ENCOUNTER — Other Ambulatory Visit: Payer: Self-pay | Admitting: Physician Assistant

## 2016-12-23 ENCOUNTER — Other Ambulatory Visit: Payer: Self-pay | Admitting: *Deleted

## 2017-01-25 ENCOUNTER — Ambulatory Visit (INDEPENDENT_AMBULATORY_CARE_PROVIDER_SITE_OTHER): Payer: BLUE CROSS/BLUE SHIELD | Admitting: Physician Assistant

## 2017-01-25 ENCOUNTER — Encounter: Payer: Self-pay | Admitting: Physician Assistant

## 2017-01-25 VITALS — BP 126/84 | HR 76 | Temp 97.0°F | Ht 66.0 in | Wt 173.0 lb

## 2017-01-25 DIAGNOSIS — B001 Herpesviral vesicular dermatitis: Secondary | ICD-10-CM

## 2017-01-25 DIAGNOSIS — E663 Overweight: Secondary | ICD-10-CM

## 2017-01-25 DIAGNOSIS — G2581 Restless legs syndrome: Secondary | ICD-10-CM | POA: Diagnosis not present

## 2017-01-25 DIAGNOSIS — F5104 Psychophysiologic insomnia: Secondary | ICD-10-CM | POA: Diagnosis not present

## 2017-01-25 DIAGNOSIS — F17200 Nicotine dependence, unspecified, uncomplicated: Secondary | ICD-10-CM | POA: Diagnosis not present

## 2017-01-25 DIAGNOSIS — F419 Anxiety disorder, unspecified: Secondary | ICD-10-CM | POA: Diagnosis not present

## 2017-01-25 DIAGNOSIS — F331 Major depressive disorder, recurrent, moderate: Secondary | ICD-10-CM

## 2017-01-25 MED ORDER — BUPROPION HCL ER (XL) 150 MG PO TB24: 150.0000 mg | ORAL_TABLET | Freq: Every day | ORAL | 2 refills | Status: DC

## 2017-01-25 MED ORDER — TRAZODONE HCL 50 MG PO TABS: ORAL_TABLET | ORAL | 3 refills | Status: DC

## 2017-01-25 MED ORDER — VALACYCLOVIR HCL 500 MG PO TABS: 500.0000 mg | ORAL_TABLET | Freq: Two times a day (BID) | ORAL | 2 refills | Status: DC

## 2017-01-25 NOTE — Patient Instructions (Signed)

## 2017-01-26 DIAGNOSIS — G2581 Restless legs syndrome: Secondary | ICD-10-CM | POA: Insufficient documentation

## 2017-01-26 DIAGNOSIS — B001 Herpesviral vesicular dermatitis: Secondary | ICD-10-CM | POA: Insufficient documentation

## 2017-01-26 MED ORDER — ESCITALOPRAM OXALATE 20 MG PO TABS: ORAL_TABLET | ORAL | 0 refills | Status: DC

## 2017-01-26 NOTE — Progress Notes (Signed)
Subjective:    Patient ID: Tonya Malone, female    DOB: [birth year], 51 y.o.   MRN: [id number]  HPI Pt is a 51 yo female who presents to the clinic for 6 month follow up and medication refills.   MDD- she is doing ok. She has her good and bad days. We are coming up on the anniversary of her daughters overdose death. Days can be hard for patient. She denies any suicidal thoughts. She is working full time. She does not go to any counseling. She does try to talk to child father regularly to connect over death of their daughter. She does feel like lexapro helps a lot.   Continues to smoke daily. She has decreased a lot but helps with stress and anxiety.   She really would like help to lose some weight. She lost weight on phentermine and request it again. She is not currently exercising.   Cold sores- needs a refill. Does not take daily only when she feels lesions coming on.   Insomnia-she continues to struggle with sleep. She has tried zzquil with little relief. Melatonin has not helped in the past. She does have some "leg jumping and burning at night" she wonders if anything else could be going on.     Review of Systems  All other systems reviewed and are negative.      Objective:   Physical Exam  Constitutional: She is oriented to person, place, and time. She appears well-developed and well-nourished.  HENT:  Head: Normocephalic and atraumatic.  Eyes: Conjunctivae are normal.  Neck: Normal range of motion. Neck supple.  Cardiovascular: Normal rate, regular rhythm and normal heart sounds.   Pulmonary/Chest: Effort normal and breath sounds normal.  Lymphadenopathy:    She has no cervical adenopathy.  Neurological: She is alert and oriented to person, place, and time.  Psychiatric: She has a normal mood and affect. Her behavior is normal.          Assessment & Plan:  Marland KitchenMarland KitchenDiagnoses and all orders for this visit:  Moderate episode of recurrent major depressive disorder  (HCC) -     buPROPion (WELLBUTRIN XL) 150 MG 24 hr tablet; Take 1 tablet (150 mg total) by mouth daily. -     escitalopram (LEXAPRO) 20 MG tablet; TAKE 1 TABLET EVERY DAY  Anxiety -     buPROPion (WELLBUTRIN XL) 150 MG 24 hr tablet; Take 1 tablet (150 mg total) by mouth daily. -     escitalopram (LEXAPRO) 20 MG tablet; TAKE 1 TABLET EVERY DAY  Psychophysiological insomnia -     traZODone (DESYREL) 50 MG tablet; TAKE 1/2 TO 1 TABLET AT BEDTIME AS NEEDED FOR SLEEP  Recurrent cold sores -     valACYclovir (VALTREX) 500 MG tablet; Take 1 tablet (500 mg total) by mouth 2 (two) times daily.  Smoker -     buPROPion (WELLBUTRIN XL) 150 MG 24 hr tablet; Take 1 tablet (150 mg total) by mouth daily.  Overweight -     buPROPion (WELLBUTRIN XL) 150 MG 24 hr tablet; Take 1 tablet (150 mg total) by mouth daily.  RLS (restless legs syndrome)   .[name] Depression screen PHQ 2/9 01/25/2017  Decreased Interest 2  Down, Depressed, Hopeless 1  PHQ - 2 Score 3  Altered sleeping 2  Tired, decreased energy 3  Change in appetite 1  Feeling bad or failure about yourself  1  Trouble concentrating 0  Moving slowly or fidgety/restless 2  Suicidal thoughts 0  PHQ-9 Score 12   Added wellbutrin to lexapro to see if could help with weight/depression/smoking.  Encouraged to cut back and stop smoking. Pt is interested.  Refill valtrex prn given.  Discussed sleep. Start trazodone. Given HO for symptomatic care of RLS. Discussed there are medication but try this first. Consider adding iron tablet to diet and/or melatonin with trazodone.  Follow up as needed or in 3 months.   I did not add phentermine this time. She is almost to goal and historically gained the weight back.

## 2017-01-28 ENCOUNTER — Other Ambulatory Visit: Payer: Self-pay | Admitting: Physician Assistant

## 2017-02-22 ENCOUNTER — Other Ambulatory Visit: Payer: Self-pay | Admitting: *Deleted

## 2017-02-22 DIAGNOSIS — B001 Herpesviral vesicular dermatitis: Secondary | ICD-10-CM

## 2017-02-22 DIAGNOSIS — E663 Overweight: Secondary | ICD-10-CM

## 2017-02-22 DIAGNOSIS — F331 Major depressive disorder, recurrent, moderate: Secondary | ICD-10-CM

## 2017-02-22 DIAGNOSIS — F419 Anxiety disorder, unspecified: Secondary | ICD-10-CM

## 2017-02-22 DIAGNOSIS — F17200 Nicotine dependence, unspecified, uncomplicated: Secondary | ICD-10-CM

## 2017-02-22 MED ORDER — BUPROPION HCL ER (XL) 150 MG PO TB24: 150.0000 mg | ORAL_TABLET | Freq: Every day | ORAL | 0 refills | Status: DC

## 2017-02-22 MED ORDER — VALACYCLOVIR HCL 500 MG PO TABS: 500.0000 mg | ORAL_TABLET | Freq: Two times a day (BID) | ORAL | 1 refills | Status: DC

## 2017-06-21 ENCOUNTER — Other Ambulatory Visit: Payer: Self-pay | Admitting: Physician Assistant

## 2017-06-21 DIAGNOSIS — F419 Anxiety disorder, unspecified: Secondary | ICD-10-CM

## 2017-06-21 DIAGNOSIS — F331 Major depressive disorder, recurrent, moderate: Secondary | ICD-10-CM

## 2017-07-19 ENCOUNTER — Other Ambulatory Visit: Payer: Self-pay | Admitting: Physician Assistant

## 2017-07-19 DIAGNOSIS — F419 Anxiety disorder, unspecified: Secondary | ICD-10-CM

## 2017-07-19 DIAGNOSIS — F331 Major depressive disorder, recurrent, moderate: Secondary | ICD-10-CM

## 2017-07-24 IMAGING — DX DG CHEST 2V
2 series · 2 of 2 positions shown · non-contrast
Comparison: None.

CLINICAL DATA: Persistent cough for 2 weeks.

EXAM:
CHEST  2 VIEW

[chest pa]
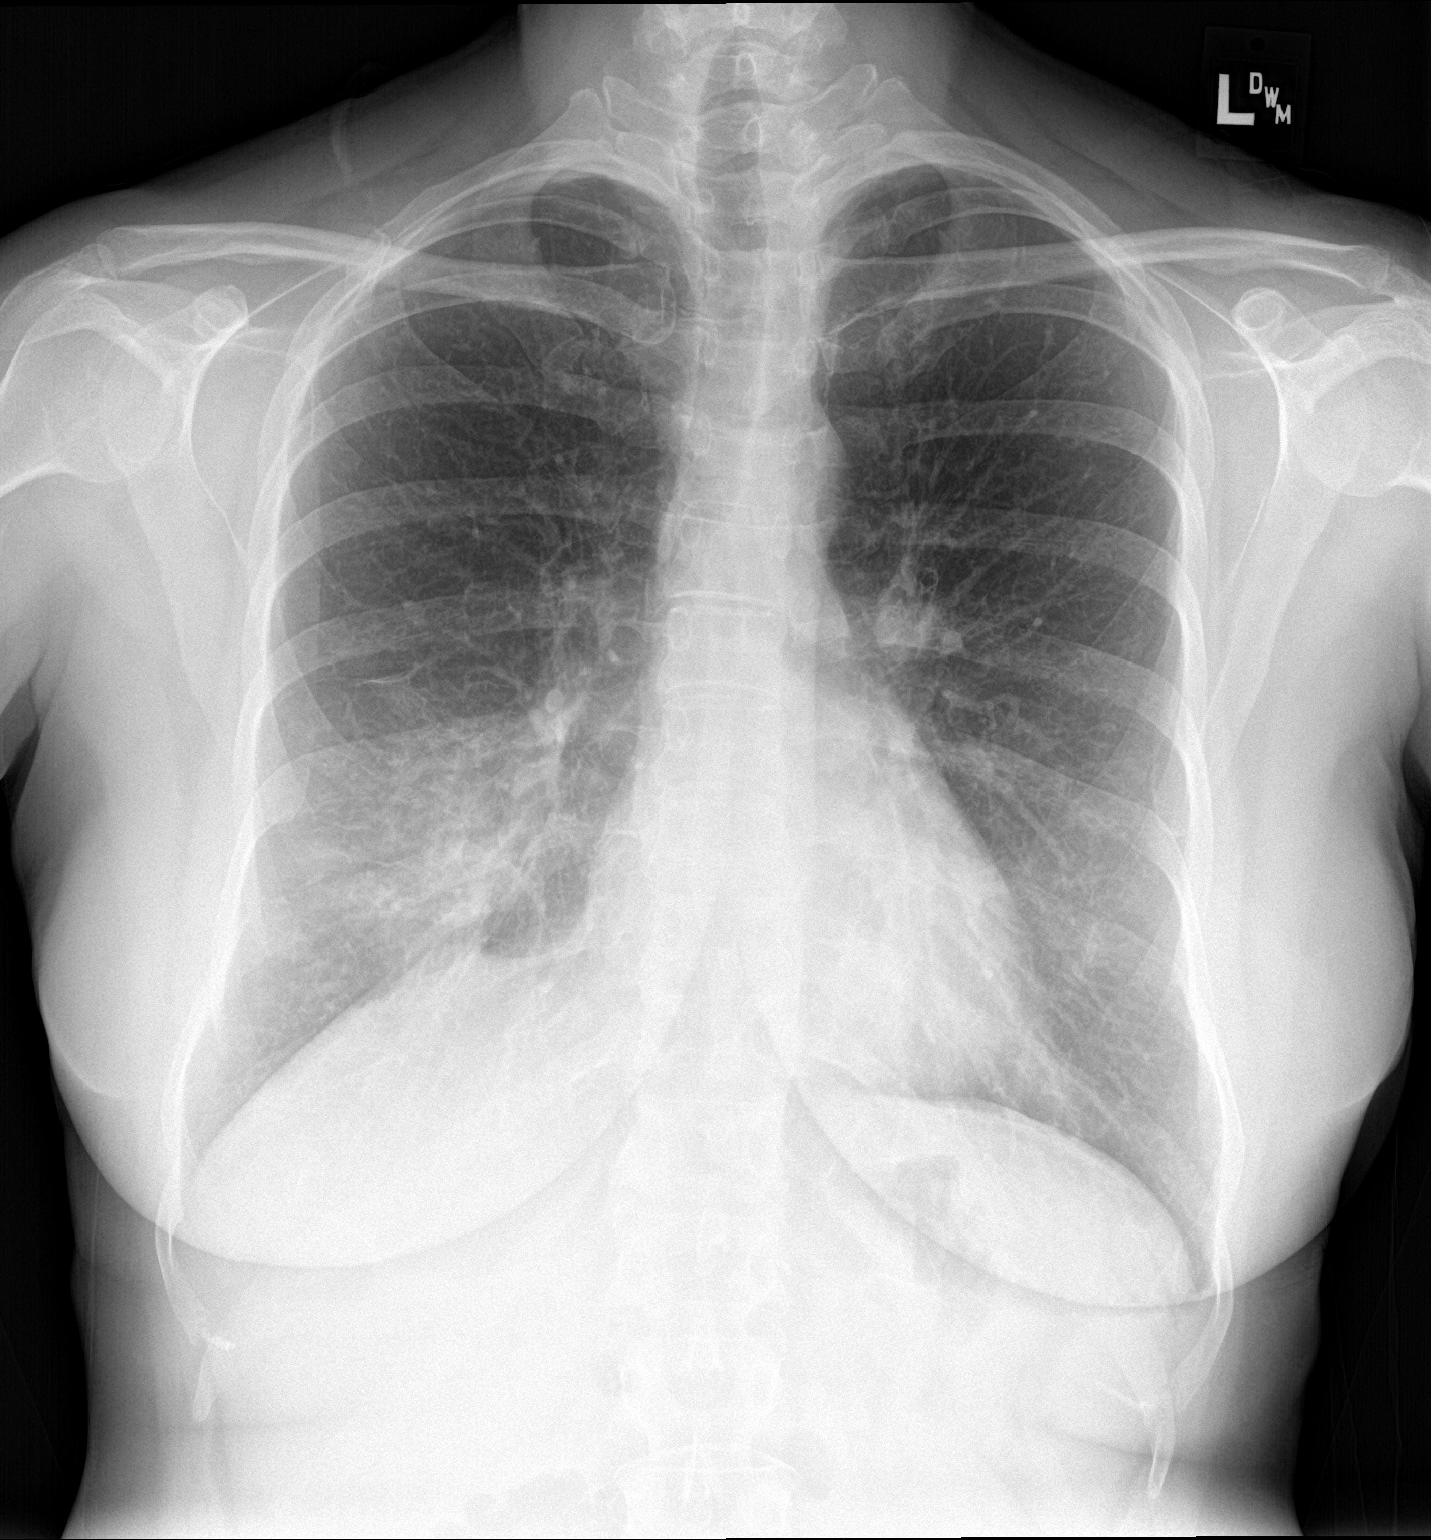

[chest lat]
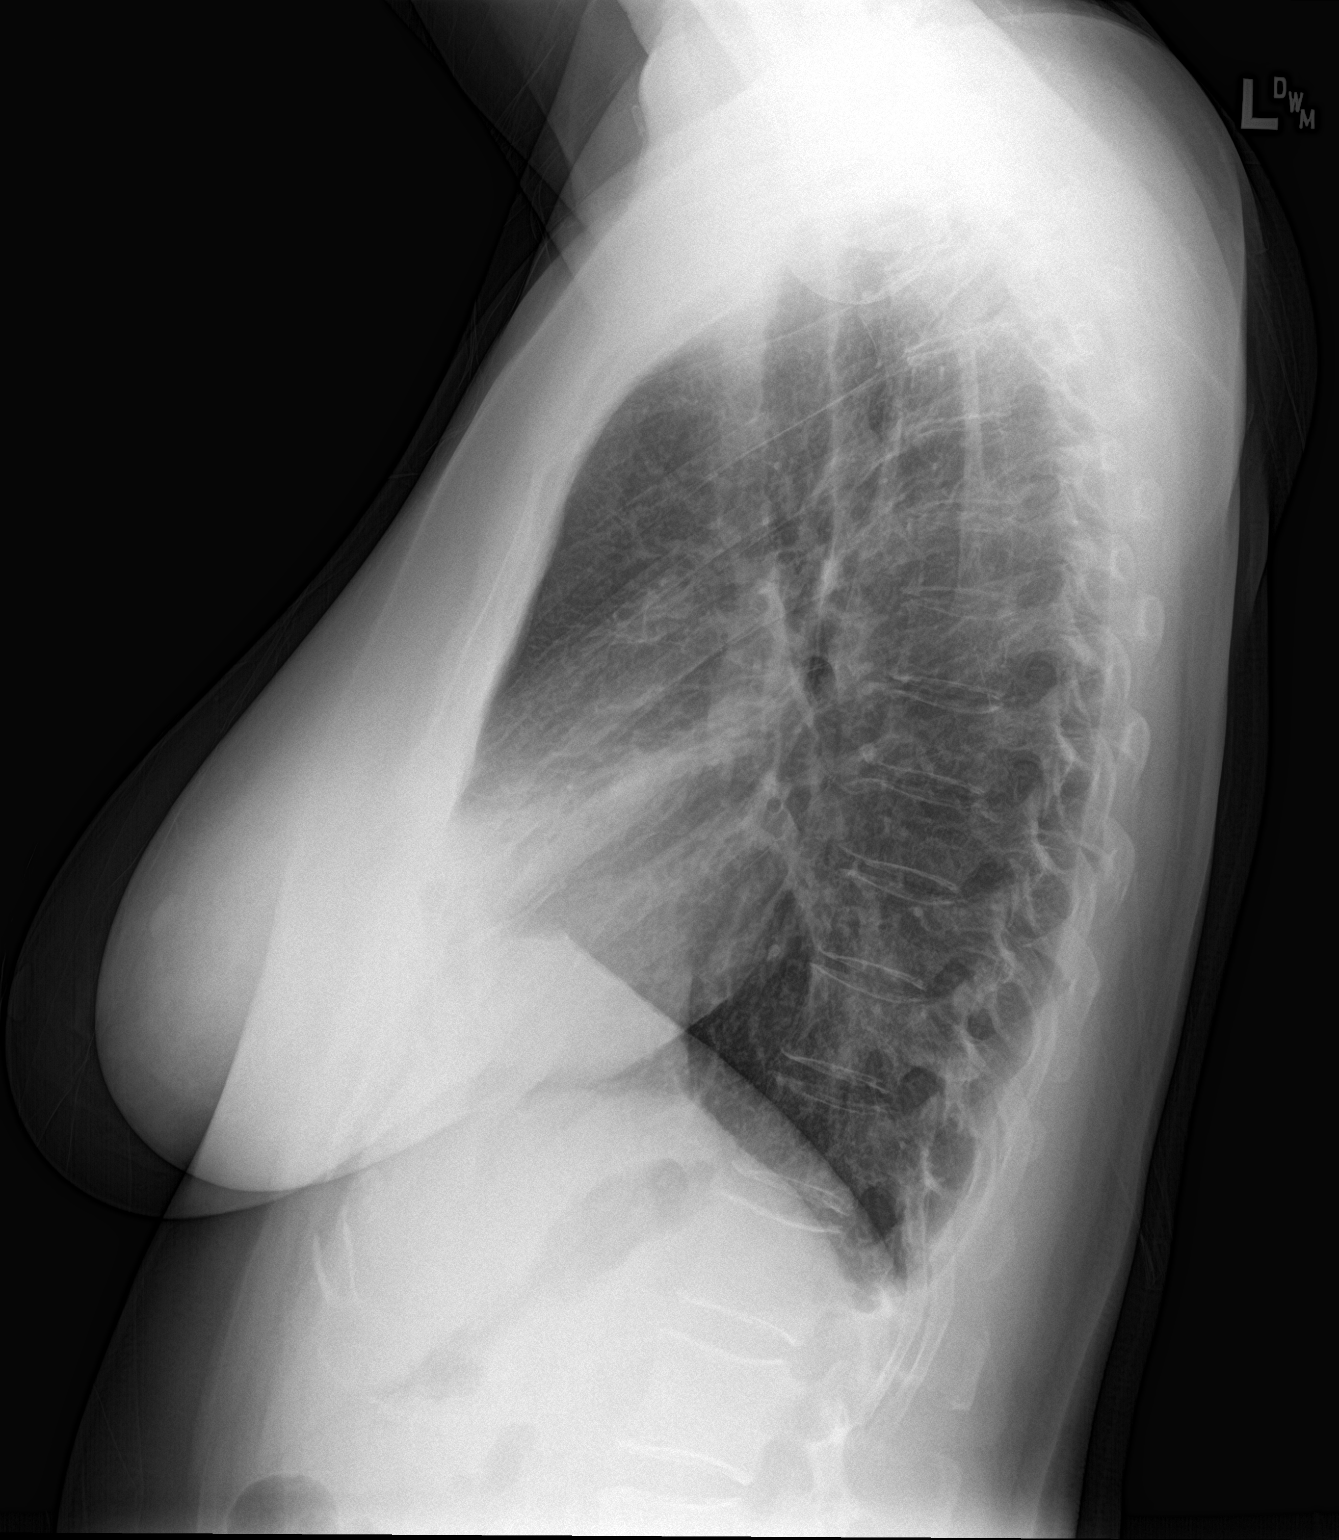

[2 of 2 positions shown; findings below may reference images not displayed]

FINDINGS: The heart size and mediastinal contours are within normal limits.
There is patchy consolidation of right lung base. The left lung is
clear. There is no pleural effusion or pulmonary edema. The
visualized skeletal structures are unremarkable.
IMPRESSION: Pneumonia right lung base.

## 2017-08-01 ENCOUNTER — Other Ambulatory Visit: Payer: Self-pay | Admitting: Physician Assistant

## 2017-08-01 DIAGNOSIS — F331 Major depressive disorder, recurrent, moderate: Secondary | ICD-10-CM

## 2017-08-01 DIAGNOSIS — F419 Anxiety disorder, unspecified: Secondary | ICD-10-CM

## 2017-08-19 ENCOUNTER — Other Ambulatory Visit: Payer: Self-pay | Admitting: Physician Assistant

## 2017-08-19 DIAGNOSIS — F331 Major depressive disorder, recurrent, moderate: Secondary | ICD-10-CM

## 2017-08-19 DIAGNOSIS — F419 Anxiety disorder, unspecified: Secondary | ICD-10-CM

## 2017-08-28 ENCOUNTER — Other Ambulatory Visit: Payer: Self-pay | Admitting: Physician Assistant

## 2017-08-28 DIAGNOSIS — F331 Major depressive disorder, recurrent, moderate: Secondary | ICD-10-CM

## 2017-08-28 DIAGNOSIS — F419 Anxiety disorder, unspecified: Secondary | ICD-10-CM

## 2017-09-02 ENCOUNTER — Other Ambulatory Visit: Payer: Self-pay

## 2017-09-02 DIAGNOSIS — F331 Major depressive disorder, recurrent, moderate: Secondary | ICD-10-CM

## 2017-09-02 DIAGNOSIS — F419 Anxiety disorder, unspecified: Secondary | ICD-10-CM

## 2017-09-02 MED ORDER — ESCITALOPRAM OXALATE 20 MG PO TABS: 20.0000 mg | ORAL_TABLET | Freq: Every day | ORAL | 0 refills | Status: DC

## 2017-09-02 NOTE — Telephone Encounter (Signed)
PT needs a refill on lexapro she's been out a couple of days. Asked her if she needs to make an appt. Pt [facility]

## 2017-09-02 NOTE — Telephone Encounter (Signed)
Spoke with Pt, advised she was due for follow up. Transferred to scheduler. 2 week supply sent.

## 2017-09-02 NOTE — Addendum Note (Signed)
Addended by: Collie SiadICHARDSON, Eldar Robitaille M on: 09/02/2017 02:48 PM   Modules accepted: Orders

## 2017-09-06 ENCOUNTER — Encounter: Payer: Self-pay | Admitting: Physician Assistant

## 2017-09-06 ENCOUNTER — Ambulatory Visit (INDEPENDENT_AMBULATORY_CARE_PROVIDER_SITE_OTHER): Payer: BLUE CROSS/BLUE SHIELD | Admitting: Physician Assistant

## 2017-09-06 VITALS — BP 138/84 | HR 80 | Ht 66.0 in | Wt 171.0 lb

## 2017-09-06 DIAGNOSIS — F5101 Primary insomnia: Secondary | ICD-10-CM | POA: Diagnosis not present

## 2017-09-06 DIAGNOSIS — E663 Overweight: Secondary | ICD-10-CM

## 2017-09-06 DIAGNOSIS — Z23 Encounter for immunization: Secondary | ICD-10-CM | POA: Diagnosis not present

## 2017-09-06 DIAGNOSIS — F331 Major depressive disorder, recurrent, moderate: Secondary | ICD-10-CM

## 2017-09-06 DIAGNOSIS — F17200 Nicotine dependence, unspecified, uncomplicated: Secondary | ICD-10-CM

## 2017-09-06 DIAGNOSIS — F419 Anxiety disorder, unspecified: Secondary | ICD-10-CM | POA: Diagnosis not present

## 2017-09-06 MED ORDER — BUPROPION HCL ER (XL) 150 MG PO TB24: 150.0000 mg | ORAL_TABLET | Freq: Every day | ORAL | 1 refills | Status: DC

## 2017-09-06 MED ORDER — ESCITALOPRAM OXALATE 20 MG PO TABS: 20.0000 mg | ORAL_TABLET | Freq: Every day | ORAL | 1 refills | Status: DC

## 2017-09-06 MED ORDER — PHENTERMINE HCL 37.5 MG PO TABS: 37.5000 mg | ORAL_TABLET | Freq: Every day | ORAL | 0 refills | Status: DC

## 2017-09-06 MED ORDER — TRAZODONE HCL 50 MG PO TABS: ORAL_TABLET | ORAL | 1 refills | Status: DC

## 2017-09-06 NOTE — Patient Instructions (Signed)
genesight testing.

## 2017-09-06 NOTE — Progress Notes (Signed)
Subjective:    Patient ID: Tonya Malone, female    DOB: August 27, 1966, 51 y.o.   MRN: [id number]  HPI  Patient is a 51 year old female who presents to the clinic for follow-up on depression, anxiety, insomnia.  Patient is doing very well on Lexapro.  She denies any side effects.  She is not taking the Wellbutrin.  She is unsure why she stopped the Wellbutrin.  She is unsure if she had any benefit to depression or smoking cessation.  She continues to debate.  She has stopped smoking cigarettes and down to 12 mcg of nicotine.  Her goal is to completely stop.  Her insomnia is well controlled with trazodone. She needs refills.   Patient would like to consider restarting phentermine for weight loss.  The last time we gave her phentermine was May 2017.  She lost about 25 pounds then.  She admits she is not exercising or actively dieting.  She denies any side effects to phentermine.  Her BMI is 27.  .. Active Ambulatory Problems    Diagnosis Date Noted  . Overweight 12/02/2015  . Depression 12/02/2015  . Insomnia 12/02/2015  . Anxiety 12/02/2015  . Urticaria 07/14/2016  . Smoker 07/14/2016  . 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 RLS (restless legs syndrome) 01/26/2017   Resolved Ambulatory Problems    Diagnosis Date Noted  . CAP (community acquired pneumonia) 07/14/2016   No Additional Past Medical History     Review of Systems  All other systems reviewed and are negative.      Objective:   Physical Exam  Constitutional: She is oriented to person, place, and time. She appears well-developed and well-nourished.  HENT:  Head: Normocephalic and atraumatic.  Cardiovascular: Normal rate, regular rhythm and normal heart sounds.   Pulmonary/Chest: Effort normal and breath sounds normal. She has no wheezes.  Neurological: She is alert and oriented to person, place, and time.  Psychiatric: She has a normal mood and affect. Her behavior is normal.          Assessment & Plan:   [name].[name].Diagnoses and all orders for this visit:  Moderate episode of recurrent major depressive disorder (HCC) -     escitalopram (LEXAPRO) 20 MG tablet; Take 1 tablet (20 mg total) by mouth daily. -     buPROPion (WELLBUTRIN XL) 150 MG 24 hr tablet; Take 1 tablet (150 mg total) by mouth daily.  Influenza vaccine needed -     Flu Vaccine QUAD 6+ mos PF IM (Fluarix Quad PF)  Anxiety -     escitalopram (LEXAPRO) 20 MG tablet; Take 1 tablet (20 mg total) by mouth daily. -     buPROPion (WELLBUTRIN XL) 150 MG 24 hr tablet; Take 1 tablet (150 mg total) by mouth daily.  Primary insomnia -     traZODone (DESYREL) 50 MG tablet; TAKE 1/2 TO 1 TABLET AT BEDTIME AS NEEDED FOR SLEEP  Smoker -     buPROPion (WELLBUTRIN XL) 150 MG 24 hr tablet; Take 1 tablet (150 mg total) by mouth daily.  Overweight -     buPROPion (WELLBUTRIN XL) 150 MG 24 hr tablet; Take 1 tablet (150 mg total) by mouth daily. -     phentermine (ADIPEX-P) 37.5 MG tablet; Take 1 tablet (37.5 mg total) by mouth daily before breakfast.  .. [facility] screen [facility] 09/06/2017 01/25/2017  Decreased Interest 2 2  Down, Depressed, Hopeless 1 1  PHQ - 2 Score 3 3  Altered sleeping 0 2  Tired,  decreased energy 3 3  Change in appetite 0 1  Feeling bad or failure about yourself  0 1  Trouble concentrating 0 0  Moving slowly or fidgety/restless 0 2  Suicidal thoughts 0 0  PHQ-9 Score 6 12   .[name] GAD 7 : Generalized Anxiety Score 01/25/2017  Nervous, Anxious, on Edge 2  Control/stop worrying 2  Worry too much - different things 2  Trouble relaxing 2  Restless 2  Easily annoyed or irritable 1  Afraid - awful might happen 0  Total GAD 7 Score 11    Overall depression anxiety numbers are stable.  Seems like she could be slightly more anxious than depressed.  She does have a son at home but is battling severe anxiety and this is causing her a lot of mental strain.  She thinks that is where some of her mental fatigue is coming  from.  Refilled Lexapro for the next 6 months.  Refilled Wellbutrin for depression and to see if it could help with her smoking cessation.  Over the next 6 months our goal would be for her to completely be off vaping.  Patient is in agreement.  I also discussed that Wellbutrin could help with weight loss.  Since it has been over a year since we have prescribed phentermine I went ahead and prescribed her phentermine.  We discussed potential side effects.  Encouraged 1300-1500-calorie diet and increasing protein to about 80 g a day.  I encouraged counting her steps or exercising at least 150 minutes a week.  Follow-up in 1 month for a nurse visit weight check for weight loss and side effects.  [name].Spent 30 minutes with patient and greater than 50 percent of visit spent counseling patient regarding treatment plan.

## 2017-10-04 ENCOUNTER — Ambulatory Visit (INDEPENDENT_AMBULATORY_CARE_PROVIDER_SITE_OTHER): Payer: BLUE CROSS/BLUE SHIELD | Admitting: Physician Assistant

## 2017-10-04 ENCOUNTER — Ambulatory Visit: Payer: BLUE CROSS/BLUE SHIELD

## 2017-10-04 DIAGNOSIS — E663 Overweight: Secondary | ICD-10-CM

## 2017-10-04 MED ORDER — PHENTERMINE HCL 37.5 MG PO TABS: 37.5000 mg | ORAL_TABLET | Freq: Every day | ORAL | 0 refills | Status: DC

## 2017-10-04 NOTE — Progress Notes (Signed)
   Subjective:    Patient ID: Tonya Malone, female    DOB: July 05, 1966, 51 y.o.   MRN: [id number]  HPI Patient here for weight and BP check after re-starting phentermine 4 weeks ago. She denies trouble sleeping, palpitations or medication problems. She would like to continue rx.    Review of Systems     Objective:   Physical Exam        Assessment & Plan:  Patient has lost weight over past 4 weeks. A refill for phntermine will be faxed to pharmacy. Patient advised toschedule a follow up with nurse in 30 days.  Agree with above plan. Tandy GawJade Breeback PA-C

## 2017-11-02 ENCOUNTER — Ambulatory Visit (INDEPENDENT_AMBULATORY_CARE_PROVIDER_SITE_OTHER): Payer: BLUE CROSS/BLUE SHIELD | Admitting: Physician Assistant

## 2017-11-02 DIAGNOSIS — E663 Overweight: Secondary | ICD-10-CM

## 2017-11-02 MED ORDER — PHENTERMINE HCL 37.5 MG PO TABS: 37.5000 mg | ORAL_TABLET | Freq: Every day | ORAL | 0 refills | Status: DC

## 2017-11-02 NOTE — Progress Notes (Signed)
Spoke to patient gave her advise as noted below. Patient verbally understood. Odyn Turko,CMA

## 2017-11-02 NOTE — Progress Notes (Signed)
Patient was in office for weight check. Patient last weight was 166 lbs and today it was 170 lbs. Patient stated that it was the holiday food. Please advise if refill would be appropriate. Rhonda Cunningham,CMA  First month of weight gain. Will give one more month. If weight loss not greater than 4lbs. Will need to discontinue or discuss other weight loss options. Jade Breeback PA-C.

## 2017-12-09 ENCOUNTER — Encounter: Payer: Self-pay | Admitting: Family Medicine

## 2017-12-09 ENCOUNTER — Ambulatory Visit (INDEPENDENT_AMBULATORY_CARE_PROVIDER_SITE_OTHER): Payer: BLUE CROSS/BLUE SHIELD | Admitting: Family Medicine

## 2017-12-09 VITALS — BP 141/84 | HR 83 | Wt 166.0 lb

## 2017-12-09 DIAGNOSIS — E663 Overweight: Secondary | ICD-10-CM

## 2017-12-09 DIAGNOSIS — Z6827 Body mass index (BMI) 27.0-27.9, adult: Secondary | ICD-10-CM | POA: Diagnosis not present

## 2017-12-09 MED ORDER — PHENTERMINE HCL 37.5 MG PO TABS: 37.5000 mg | ORAL_TABLET | Freq: Every day | ORAL | 0 refills | Status: DC

## 2017-12-09 NOTE — Progress Notes (Signed)
Patient is here for blood pressure and weight check. Denies any trouble sleeping, palpitations, or any other medication problems. Patient has lost weight. A refill for Phentermine will be sent to patient preferred pharmacy. Patient advised to schedule a four week nurse visit and keep her upcoming appointment with her PCP. Verbalized understanding, no further questions. 

## 2017-12-09 NOTE — Progress Notes (Signed)
Abnormal weight gain/BMI 27 - she is actually down 4 pounds which is fantastic.  Will refill medications.  Keep follow-up in 4 weeks.  Nani Gasseratherine Adraine Biffle, MD

## 2018-01-06 ENCOUNTER — Encounter: Payer: Self-pay | Admitting: Physician Assistant

## 2018-01-06 ENCOUNTER — Ambulatory Visit (INDEPENDENT_AMBULATORY_CARE_PROVIDER_SITE_OTHER): Payer: BLUE CROSS/BLUE SHIELD | Admitting: Physician Assistant

## 2018-01-06 VITALS — BP 129/92 | HR 95 | Ht 66.0 in | Wt 165.0 lb

## 2018-01-06 DIAGNOSIS — F331 Major depressive disorder, recurrent, moderate: Secondary | ICD-10-CM

## 2018-01-06 DIAGNOSIS — E010 Iodine-deficiency related diffuse (endemic) goiter: Secondary | ICD-10-CM

## 2018-01-06 DIAGNOSIS — E041 Nontoxic single thyroid nodule: Secondary | ICD-10-CM | POA: Diagnosis not present

## 2018-01-06 DIAGNOSIS — Z13220 Encounter for screening for lipoid disorders: Secondary | ICD-10-CM

## 2018-01-06 DIAGNOSIS — R7989 Other specified abnormal findings of blood chemistry: Secondary | ICD-10-CM | POA: Diagnosis not present

## 2018-01-06 DIAGNOSIS — F419 Anxiety disorder, unspecified: Secondary | ICD-10-CM

## 2018-01-06 DIAGNOSIS — Z131 Encounter for screening for diabetes mellitus: Secondary | ICD-10-CM | POA: Diagnosis not present

## 2018-01-06 DIAGNOSIS — E663 Overweight: Secondary | ICD-10-CM

## 2018-01-06 DIAGNOSIS — F5101 Primary insomnia: Secondary | ICD-10-CM | POA: Diagnosis not present

## 2018-01-06 DIAGNOSIS — F17200 Nicotine dependence, unspecified, uncomplicated: Secondary | ICD-10-CM

## 2018-01-06 MED ORDER — ESCITALOPRAM OXALATE 20 MG PO TABS: 20.0000 mg | ORAL_TABLET | Freq: Every day | ORAL | 1 refills | Status: DC

## 2018-01-06 MED ORDER — BUPROPION HCL ER (XL) 150 MG PO TB24: 150.0000 mg | ORAL_TABLET | Freq: Every day | ORAL | 1 refills | Status: DC

## 2018-01-06 MED ORDER — PHENTERMINE HCL 37.5 MG PO TABS: 37.5000 mg | ORAL_TABLET | Freq: Every day | ORAL | 0 refills | Status: DC

## 2018-01-06 MED ORDER — TRAZODONE HCL 50 MG PO TABS: ORAL_TABLET | ORAL | 1 refills | Status: DC

## 2018-01-06 NOTE — Progress Notes (Signed)
Subjective:    Patient ID: Tonya Malone, female    DOB: 1966/03/26, 52 y.o.   MRN: [id number]  HPI  Pt is a 52 yo female with MDD, insomnia, anxiety who presents to the clinic to follow up on weight. She has lost only 1lb this month and been on phentermine for 4 months with total weight loss of only 8lbs. She is only taking 1/2 tablet due to 1 full tablet causing insomnia. She denies any other side effects and reports she does feel like phentermine helps with curbing her appetite.   She would like refill on wellbutrin and lexapro for depression and anxiety. She is doing well. No concerns or complaints.   She would also like refill on trazodone for insomnia.   .. Active Ambulatory Problems    Diagnosis Date Noted  . Overweight 12/02/2015  . Depression 12/02/2015  . Insomnia 12/02/2015  . Anxiety 12/02/2015  . Urticaria 07/14/2016  . Smoker 07/14/2016  . 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 RLS (restless legs syndrome) 01/26/2017  . Thyromegaly 01/06/2018   Resolved Ambulatory Problems    Diagnosis Date Noted  . CAP (community acquired pneumonia) 07/14/2016   No Additional Past Medical History      Review of Systems  All other systems reviewed and are negative.      Objective:   Physical Exam  Constitutional: She is oriented to person, place, and time. She appears well-developed and well-nourished.  HENT:  Head: Normocephalic and atraumatic.  Neck: Normal range of motion. Neck supple. Thyromegaly present.  Diffuse thyroid enlargement.   Cardiovascular: Normal rate and normal heart sounds.  Pulmonary/Chest: Effort normal and breath sounds normal.  Neurological: She is alert and oriented to person, place, and time.  Psychiatric: She has a normal mood and affect. Her behavior is normal.          Assessment & Plan:  [name].[name].Diagnoses and all orders for this visit:  Moderate episode of recurrent major depressive disorder (HCC) -     buPROPion (WELLBUTRIN XL) 150 MG  24 hr tablet; Take 1 tablet (150 mg total) by mouth daily. -     escitalopram (LEXAPRO) 20 MG tablet; Take 1 tablet (20 mg total) by mouth daily.  Anxiety -     buPROPion (WELLBUTRIN XL) 150 MG 24 hr tablet; Take 1 tablet (150 mg total) by mouth daily. -     escitalopram (LEXAPRO) 20 MG tablet; Take 1 tablet (20 mg total) by mouth daily.  Smoker -     buPROPion (WELLBUTRIN XL) 150 MG 24 hr tablet; Take 1 tablet (150 mg total) by mouth daily.  Overweight -     buPROPion (WELLBUTRIN XL) 150 MG 24 hr tablet; Take 1 tablet (150 mg total) by mouth daily. -     phentermine (ADIPEX-P) 37.5 MG tablet; Take 1 tablet (37.5 mg total) by mouth daily before breakfast.  Primary insomnia -     traZODone (DESYREL) 50 MG tablet; TAKE 1/2 TO 1 TABLET AT BEDTIME AS NEEDED FOR SLEEP  Screening for diabetes mellitus -     COMPLETE METABOLIC PANEL WITH GFR  Screening for lipid disorders -     Lipid Panel w/reflex Direct LDL  Thyromegaly -     TSH -     US THYROID   Discussed we need to taper her off phentermine. Last rx given to start weaning off. Continue to keep exercise and diet under control.   Refills given.   Thyroid will evaluate with labs and  u/s.

## 2018-01-08 ENCOUNTER — Encounter: Payer: Self-pay | Admitting: Physician Assistant

## 2018-01-12 ENCOUNTER — Ambulatory Visit (INDEPENDENT_AMBULATORY_CARE_PROVIDER_SITE_OTHER): Payer: BLUE CROSS/BLUE SHIELD

## 2018-01-12 DIAGNOSIS — Z13220 Encounter for screening for lipoid disorders: Secondary | ICD-10-CM | POA: Diagnosis not present

## 2018-01-12 DIAGNOSIS — E010 Iodine-deficiency related diffuse (endemic) goiter: Secondary | ICD-10-CM | POA: Diagnosis not present

## 2018-01-12 DIAGNOSIS — Z131 Encounter for screening for diabetes mellitus: Secondary | ICD-10-CM | POA: Diagnosis not present

## 2018-01-12 LAB — COMPLETE METABOLIC PANEL WITH GFR
AG RATIO: 1.9 (calc) (ref 1.0–2.5)
ALT: 14 U/L (ref 6–29)
AST: 15 U/L (ref 10–35)
Albumin: 4.5 g/dL (ref 3.6–5.1)
Alkaline phosphatase (APISO): 52 U/L (ref 33–130)
BUN: 11 mg/dL (ref 7–25)
CHLORIDE: 107 mmol/L (ref 98–110)
CO2: 29 mmol/L (ref 20–32)
Calcium: 9.5 mg/dL (ref 8.6–10.4)
Creat: 0.88 mg/dL (ref 0.50–1.05)
GFR, EST AFRICAN AMERICAN: 88 mL/min/{1.73_m2} (ref >=60)
GFR, Est Non African American: 76 mL/min/{1.73_m2} (ref >=60)
Globulin: 2.4 g/dL (calc) (ref 1.9–3.7)
Glucose, Bld: 92 mg/dL (ref 65–99)
Potassium: 4.3 mmol/L (ref 3.5–5.3)
Sodium: 143 mmol/L (ref 135–146)
TOTAL PROTEIN: 6.9 g/dL (ref 6.1–8.1)
Total Bilirubin: 0.4 mg/dL (ref 0.2–1.2)

## 2018-01-12 LAB — LIPID PANEL W/REFLEX DIRECT LDL
Cholesterol: 196 mg/dL (ref <200)
HDL: 48 mg/dL — ABNORMAL LOW (ref >50)
LDL Cholesterol (Calc): 126 mg/dL (calc) — ABNORMAL HIGH
NON-HDL CHOLESTEROL (CALC): 148 mg/dL — AB (ref <130)
Total CHOL/HDL Ratio: 4.1 (calc) (ref <5.0)
Triglycerides: 112 mg/dL (ref <150)

## 2018-01-12 LAB — TSH: TSH: 0.39 mIU/L — ABNORMAL LOW

## 2018-01-13 ENCOUNTER — Other Ambulatory Visit: Payer: Self-pay | Admitting: Physician Assistant

## 2018-01-13 ENCOUNTER — Encounter: Payer: Self-pay | Admitting: Physician Assistant

## 2018-01-13 DIAGNOSIS — E041 Nontoxic single thyroid nodule: Secondary | ICD-10-CM

## 2018-01-13 NOTE — Progress Notes (Signed)
Placed.

## 2018-01-13 NOTE — Addendum Note (Signed)
Addended by: Jomarie LongsBREEBACK, Damonie Furney L on: 01/13/2018 04:31 PM   Modules accepted: Orders

## 2018-01-13 NOTE — Progress Notes (Signed)
Labs show hyperthryoidism. I believe you need to see an endocrinologist especially in lieu of abnormal u/s. Are you ok with this?  Kidney, liver, glucose look good.  Cholesterol looks pretty good. LDL not quite to goal.

## 2018-01-13 NOTE — Progress Notes (Signed)
Call pt: left thyroid nodule that meets criteria for biopsy. Thyroid is diffusely enlarged. I placed order for biospy.

## 2018-01-19 ENCOUNTER — Other Ambulatory Visit: Payer: Self-pay

## 2018-01-19 DIAGNOSIS — E041 Nontoxic single thyroid nodule: Secondary | ICD-10-CM

## 2018-02-10 ENCOUNTER — Encounter: Payer: Self-pay | Admitting: Endocrinology

## 2018-02-10 ENCOUNTER — Ambulatory Visit (INDEPENDENT_AMBULATORY_CARE_PROVIDER_SITE_OTHER): Payer: BLUE CROSS/BLUE SHIELD | Admitting: Endocrinology

## 2018-02-10 ENCOUNTER — Telehealth: Payer: Self-pay | Admitting: Endocrinology

## 2018-02-10 VITALS — BP 120/84 | HR 101 | Wt 165.0 lb

## 2018-02-10 DIAGNOSIS — E041 Nontoxic single thyroid nodule: Secondary | ICD-10-CM

## 2018-02-10 NOTE — Progress Notes (Signed)
Subjective:    Patient ID: Tonya Malone, female    DOB: 22-Nov-1965, 52 y.o.   MRN: [id number]  HPI Pt is referred by Tandy Gaw, PA, for hyperthyroidism.  Pt reports she was found to have a thyroid nodule in early 2019, incidentally on visit for URI.  she has never been on thyroid therapy.  she has never had XRT to the anterior neck, or thyroid surgery.  she does not consume kelp or any other non-prescribed thyroid medication.  she has never been on amiodarone.  She has slight palpitations in the chest, and assoc tremor.  Past Medical History:  Diagnosis Date  . Anxiety     History reviewed. No pertinent surgical history.  Social History   Socioeconomic History  . Marital status: Married    Spouse name: Not on file  . Number of children: Not on file  . Years of education: Not on file  . Highest education level: Not on file  Occupational History  . Not on file  Social Needs  . Financial resource strain: Not on file  . Food insecurity:    Worry: Not on file    Inability: Not on file  . Transportation needs:    Medical: Not on file    Non-medical: Not on file  Tobacco Use  . Smoking status: Former Smoker    Types: Cigarettes    Last attempt to quit: 11/08/2016    Years since quitting: 1.2  . Smokeless tobacco: Never Used  Substance and Sexual Activity  . Alcohol use: Yes    Alcohol/week: 0.0 oz  . Drug use: No  . Sexual activity: Yes  Lifestyle  . Physical activity:    Days per week: Not on file    Minutes per session: Not on file  . Stress: Not on file  Relationships  . Social connections:    Talks on phone: Not on file    Gets together: Not on file    Attends religious service: Not on file    Active member of club or organization: Not on file    Attends meetings of clubs or organizations: Not on file    Relationship status: Not on file  . Intimate partner violence:    Fear of current or ex partner: Not on file    Emotionally abused: Not on file   Physically abused: Not on file    Forced sexual activity: Not on file  Other Topics Concern  . Not on file  Social History Narrative  . Not on file    Current Outpatient Medications on File Prior to Visit  Medication Sig Dispense Refill  . buPROPion (WELLBUTRIN XL) 150 MG 24 hr tablet Take 1 tablet (150 mg total) by mouth daily. 90 tablet 1  . escitalopram (LEXAPRO) 20 MG tablet Take 1 tablet (20 mg total) by mouth daily. 90 tablet 1  . phentermine (ADIPEX-P) 37.5 MG tablet Take 1 tablet (37.5 mg total) by mouth daily before breakfast. 30 tablet 0  . traZODone (DESYREL) 50 MG tablet TAKE 1/2 TO 1 TABLET AT BEDTIME AS NEEDED FOR SLEEP 90 tablet 1  . valACYclovir (VALTREX) 500 MG tablet Take 1 tablet (500 mg total) by mouth 2 (two) times daily. 180 tablet 1   No current facility-administered medications on file prior to visit.     No Known Allergies  Family History  Problem Relation Age of Onset  . Cancer Mother   . Cancer Maternal Grandmother   . Thyroid disease Neg Hx  BP 120/84 (BP Location: Left Arm, Patient Position: Sitting, Cuff Size: Normal)   Pulse (!) 101   Wt 165 lb (74.8 kg)   LMP  (LMP Unknown) Comment: 2 years ago  SpO2 97%   BMI 26.63 kg/m    Review of Systems denies weight loss, headache, hoarseness, visual loss, sob, diarrhea, polyuria, muscle weakness, edema, excessive diaphoresis, anxiety, heat intolerance, easy bruising, and rhinorrhea.      Objective:   Physical Exam VS: see vs page GEN: no distress HEAD: head: no deformity eyes: no periorbital swelling, no proptosis external nose and ears are normal mouth: no lesion seen  NECK: the left thyroid nodule is easily palpable.   CHEST WALL: no deformity LUNGS: clear to auscultation CV: reg rate and rhythm, no murmur ABD: abdomen is soft, nontender.  no hepatosplenomegaly.  not distended.  no hernia MUSCULOSKELETAL: muscle bulk and strength are grossly normal.  no obvious joint swelling.  gait is  normal and steady EXTEMITIES: no deformity.  no edema PULSES: no carotid bruit NEURO:  cn 2-12 grossly intact.   readily moves all 4's.  sensation is intact to touch on all 4's SKIN:  Normal texture and temperature.  No rash or suspicious lesion is visible.   NODES:  None palpable at the neck PSYCH: alert, well-oriented.  Does not appear anxious nor depressed.     Lab Results  Component Value Date   TSH 0.39 (L) 01/12/2018   Borderline thyromegaly with solitary mildly suspicious 2.8 cm mid left nodule.  I have reviewed outside records, and summarized: Pt was noted to have nodular goiter, and referred here.  US-guided bx was advised by US report, and scheduled     Assessment & Plan:  Multinodular goiter, usually hereditary.  In view of low TSH, risk of malignancy is low.  Hyperthyroidism, due to the goiter.  Mild.  We discussed.  she declines rx for now.     Patient Instructions  You can skip the biopsy.  Please come back for a follow-up appointment in 6 months.       Hyperthyroidism Hyperthyroidism is when the thyroid is too active (overactive). Your thyroid is a large gland that is located in your neck. The thyroid helps to control how your body uses food (metabolism). When your thyroid is overactive, it produces too much of a hormone called thyroxine. What are the causes? Causes of hyperthyroidism may include:  Graves disease. This is when your immune system attacks the thyroid gland. This is the most common cause.  Inflammation of the thyroid gland.  Tumor in the thyroid gland or somewhere else.  Excessive use of thyroid medicines, including: ? Prescription thyroid supplement. ? Herbal supplements that mimic thyroid hormones.  Solid or fluid-filled lumps within your thyroid gland (thyroid nodules).  Excessive ingestion of iodine.  What increases the risk?  Being female.  Having a family history of thyroid conditions. What are the signs or symptoms? Signs and  symptoms of hyperthyroidism may include:  Nervousness.  Inability to tolerate heat.  Unexplained weight loss.  Diarrhea.  Change in the texture of hair or skin.  Heart skipping beats or making extra beats.  Rapid heart rate.  Loss of menstruation.  Shaky hands.  Fatigue.  Restlessness.  Increased appetite.  Sleep problems.  Enlarged thyroid gland or nodules.  How is this diagnosed? Diagnosis of hyperthyroidism may include:  Medical history and physical exam.  Blood tests.  Ultrasound tests.  How is this treated? Treatment may include:  Medicines  to control your thyroid.  Surgery to remove your thyroid.  Radiation therapy.  Follow these instructions at home:  Take medicines only as directed by your health care provider.  Do not use any tobacco products, including cigarettes, chewing tobacco, or electronic cigarettes. If you need help quitting, ask your health care provider.  Do not exercise or do physical activity until your health care provider approves.  Keep all follow-up appointments as directed by your health care provider. This is important. Contact a health care provider if:  Your symptoms do not get better with treatment.  You have fever.  You are taking thyroid replacement medicine and you: ? Have depression. ? Feel mentally and physically slow. ? Have weight gain. Get help right away if:  You have decreased alertness or a change in your awareness.  You have abdominal pain.  You feel dizzy.  You have a rapid heartbeat.  You have an irregular heartbeat. This information is not intended to replace advice given to you by your health care provider. Make sure you discuss any questions you have with your health care provider. Document Released: 10/25/2005 Document Revised: 03/25/2016 Document Reviewed: 03/12/2014 Elsevier Interactive Patient Education  2018 [employer].

## 2018-02-10 NOTE — Patient Instructions (Addendum)
You can skip the biopsy.  Please come back for a follow-up appointment in 6 months.       Hyperthyroidism Hyperthyroidism is when the thyroid is too active (overactive). Your thyroid is a large gland that is located in your neck. The thyroid helps to control how your body uses food (metabolism). When your thyroid is overactive, it produces too much of a hormone called thyroxine. What are the causes? Causes of hyperthyroidism may include:  Graves disease. This is when your immune system attacks the thyroid gland. This is the most common cause.  Inflammation of the thyroid gland.  Tumor in the thyroid gland or somewhere else.  Excessive use of thyroid medicines, including: ? Prescription thyroid supplement. ? Herbal supplements that mimic thyroid hormones.  Solid or fluid-filled lumps within your thyroid gland (thyroid nodules).  Excessive ingestion of iodine.  What increases the risk?  Being female.  Having a family history of thyroid conditions. What are the signs or symptoms? Signs and symptoms of hyperthyroidism may include:  Nervousness.  Inability to tolerate heat.  Unexplained weight loss.  Diarrhea.  Change in the texture of hair or skin.  Heart skipping beats or making extra beats.  Rapid heart rate.  Loss of menstruation.  Shaky hands.  Fatigue.  Restlessness.  Increased appetite.  Sleep problems.  Enlarged thyroid gland or nodules.  How is this diagnosed? Diagnosis of hyperthyroidism may include:  Medical history and physical exam.  Blood tests.  Ultrasound tests.  How is this treated? Treatment may include:  Medicines to control your thyroid.  Surgery to remove your thyroid.  Radiation therapy.  Follow these instructions at home:  Take medicines only as directed by your health care provider.  Do not use any tobacco products, including cigarettes, chewing tobacco, or electronic cigarettes. If you need help quitting, ask your  health care provider.  Do not exercise or do physical activity until your health care provider approves.  Keep all follow-up appointments as directed by your health care provider. This is important. Contact a health care provider if:  Your symptoms do not get better with treatment.  You have fever.  You are taking thyroid replacement medicine and you: ? Have depression. ? Feel mentally and physically slow. ? Have weight gain. Get help right away if:  You have decreased alertness or a change in your awareness.  You have abdominal pain.  You feel dizzy.  You have a rapid heartbeat.  You have an irregular heartbeat. This information is not intended to replace advice given to you by your health care provider. Make sure you discuss any questions you have with your health care provider. Document Released: 10/25/2005 Document Revised: 03/25/2016 Document Reviewed: 03/12/2014 Elsevier Interactive Patient Education  2018 [employer]

## 2018-02-10 NOTE — Telephone Encounter (Signed)
[facility]

## 2018-02-10 NOTE — Telephone Encounter (Signed)
please call radiol Please cancel biopsy

## 2018-02-11 ENCOUNTER — Encounter: Payer: Self-pay | Admitting: Endocrinology

## 2018-02-23 ENCOUNTER — Other Ambulatory Visit: Payer: BLUE CROSS/BLUE SHIELD

## 2018-10-16 ENCOUNTER — Other Ambulatory Visit: Payer: Self-pay | Admitting: Physician Assistant

## 2018-10-16 DIAGNOSIS — F331 Major depressive disorder, recurrent, moderate: Secondary | ICD-10-CM

## 2018-10-16 DIAGNOSIS — E663 Overweight: Secondary | ICD-10-CM

## 2018-10-16 DIAGNOSIS — F5101 Primary insomnia: Secondary | ICD-10-CM

## 2018-10-16 DIAGNOSIS — F17200 Nicotine dependence, unspecified, uncomplicated: Secondary | ICD-10-CM

## 2018-10-16 DIAGNOSIS — F419 Anxiety disorder, unspecified: Secondary | ICD-10-CM

## 2018-11-10 ENCOUNTER — Telehealth: Payer: Self-pay

## 2018-11-10 ENCOUNTER — Other Ambulatory Visit: Payer: Self-pay | Admitting: Physician Assistant

## 2018-11-10 DIAGNOSIS — F419 Anxiety disorder, unspecified: Secondary | ICD-10-CM

## 2018-11-10 DIAGNOSIS — F331 Major depressive disorder, recurrent, moderate: Secondary | ICD-10-CM

## 2018-11-10 MED ORDER — SCOPOLAMINE 1 MG/3DAYS TD PT72: 1.0000 | MEDICATED_PATCH | TRANSDERMAL | 0 refills | Status: DC

## 2018-11-10 NOTE — Telephone Encounter (Signed)
Called and left voicemail notifying patient that prescription has been sent. Office contact information provided if any further questions or concerns.

## 2018-11-10 NOTE — Telephone Encounter (Signed)
Patient called today in need of motion sickness patches. Patient states she is going on a cruise next week, and would like to have some of those patches to prevent nausea. Pharmacy on file is correct. Thanks!

## 2018-11-10 NOTE — Telephone Encounter (Signed)
Sent to pharmacy 

## 2018-11-17 ENCOUNTER — Other Ambulatory Visit: Payer: Self-pay | Admitting: Physician Assistant

## 2018-11-17 DIAGNOSIS — F419 Anxiety disorder, unspecified: Secondary | ICD-10-CM

## 2018-11-17 DIAGNOSIS — F17200 Nicotine dependence, unspecified, uncomplicated: Secondary | ICD-10-CM

## 2018-11-17 DIAGNOSIS — F331 Major depressive disorder, recurrent, moderate: Secondary | ICD-10-CM

## 2018-11-17 DIAGNOSIS — E663 Overweight: Secondary | ICD-10-CM

## 2018-12-03 ENCOUNTER — Other Ambulatory Visit: Payer: Self-pay | Admitting: Physician Assistant

## 2018-12-03 DIAGNOSIS — F331 Major depressive disorder, recurrent, moderate: Secondary | ICD-10-CM

## 2018-12-03 DIAGNOSIS — F419 Anxiety disorder, unspecified: Secondary | ICD-10-CM

## 2018-12-16 ENCOUNTER — Other Ambulatory Visit: Payer: Self-pay | Admitting: Physician Assistant

## 2018-12-16 DIAGNOSIS — F5101 Primary insomnia: Secondary | ICD-10-CM

## 2019-01-09 ENCOUNTER — Other Ambulatory Visit: Payer: Self-pay | Admitting: Physician Assistant

## 2019-01-09 DIAGNOSIS — F419 Anxiety disorder, unspecified: Secondary | ICD-10-CM

## 2019-01-09 DIAGNOSIS — F331 Major depressive disorder, recurrent, moderate: Secondary | ICD-10-CM

## 2019-02-05 ENCOUNTER — Other Ambulatory Visit: Payer: Self-pay | Admitting: Physician Assistant

## 2019-02-05 DIAGNOSIS — F419 Anxiety disorder, unspecified: Secondary | ICD-10-CM

## 2019-02-05 DIAGNOSIS — F331 Major depressive disorder, recurrent, moderate: Secondary | ICD-10-CM

## 2019-03-12 ENCOUNTER — Ambulatory Visit (INDEPENDENT_AMBULATORY_CARE_PROVIDER_SITE_OTHER): Payer: BLUE CROSS/BLUE SHIELD | Admitting: Physician Assistant

## 2019-03-12 ENCOUNTER — Encounter: Payer: Self-pay | Admitting: Physician Assistant

## 2019-03-12 VITALS — Temp 97.3°F | Ht 66.0 in | Wt 180.0 lb

## 2019-03-12 DIAGNOSIS — F331 Major depressive disorder, recurrent, moderate: Secondary | ICD-10-CM

## 2019-03-12 DIAGNOSIS — E663 Overweight: Secondary | ICD-10-CM

## 2019-03-12 DIAGNOSIS — F419 Anxiety disorder, unspecified: Secondary | ICD-10-CM

## 2019-03-12 DIAGNOSIS — F17200 Nicotine dependence, unspecified, uncomplicated: Secondary | ICD-10-CM

## 2019-03-12 DIAGNOSIS — B001 Herpesviral vesicular dermatitis: Secondary | ICD-10-CM

## 2019-03-12 DIAGNOSIS — F5101 Primary insomnia: Secondary | ICD-10-CM

## 2019-03-12 MED ORDER — TRAZODONE HCL 50 MG PO TABS: ORAL_TABLET | ORAL | 3 refills | Status: DC

## 2019-03-12 MED ORDER — BUPROPION HCL ER (XL) 150 MG PO TB24: 150.0000 mg | ORAL_TABLET | Freq: Every day | ORAL | 3 refills | Status: DC

## 2019-03-12 MED ORDER — ESCITALOPRAM OXALATE 20 MG PO TABS: 20.0000 mg | ORAL_TABLET | Freq: Every day | ORAL | 3 refills | Status: DC

## 2019-03-12 NOTE — Progress Notes (Signed)
..Virtual Visit via Video Note  I connected with Tonya Malone on 03/12/19 at  4:20 PM EDT by a video enabled telemedicine application and verified that I am speaking with the correct person using two identifiers.  Location: Patient: work Provider: clinic   I discussed the limitations of evaluation and management by telemedicine and the availability of in person appointments. The patient expressed understanding and agreed to proceed.  History of Present Illness: Pt is a 53 yo female with MDD, GAD, insomnia who calls into the clinic for medication refills.   Pt is doing well. 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 No SI/HC. She has been out of her wellbutrin and lexapro.   Her sleep is well controlled on trazodone.   Cold sores controlled with suppressive valtrex.   .. Active Ambulatory Problems    Diagnosis Date Noted  . Overweight 12/02/2015  . Depression 12/02/2015  . Insomnia 12/02/2015  . Anxiety 12/02/2015  . Urticaria 07/14/2016  . Smoker 07/14/2016  . 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 RLS (restless legs syndrome) 01/26/2017  . Thyromegaly 01/06/2018  . Left thyroid nodule 01/13/2018   Resolved Ambulatory Problems    Diagnosis Date Noted  . CAP (community acquired pneumonia) 07/14/2016   No Additional Past Medical History   Reviewed med, allergy, problem list.     Observations/Objective: No acute distress.  Normal mood.  Normal appearance.   .. Today's Vitals   03/12/19 1614  Temp: (!) 97.3 F (36.3 C)  TempSrc: Oral  Weight: 180 lb (81.6 kg)  Height: 5\' 6"  (1.676 m)   Body mass index is 29.05 kg/m.  .. [facility] screen [facility] 03/12/2019 09/06/2017 01/25/2017  Decreased Interest 0 2 2  Down, Depressed, Hopeless 0 1 1  PHQ - 2 Score 0 3 3  Altered sleeping 1 0 2  Tired, decreased energy 3 3 3   Change in appetite 0 0 1  Feeling bad or failure about yourself  0 0 1  Trouble  concentrating 0 0 0  Moving slowly or fidgety/restless 3 0 2  Suicidal thoughts 0 0 0  PHQ-9 Score 7 6 12   Difficult doing work/chores Somewhat difficult - -   . GAD 7 : Generalized Anxiety Score 03/12/2019 01/25/2017  Nervous, Anxious, on Edge 3 2  Control/stop worrying 3 2  Worry too much - different things 3 2  Trouble relaxing 1 2  Restless 0 2  Easily annoyed or irritable 3 1  Afraid - awful might happen 0 0  Total GAD 7 Score 13 11  Anxiety Difficulty Somewhat difficult -     Assessment and Plan: [name].[name].Tonya Malone was seen today for depression.  Diagnoses and all orders for this visit:  Moderate episode of recurrent major depressive disorder (HCC) -     escitalopram (LEXAPRO) 20 MG tablet; Take 1 tablet (20 mg total) by mouth daily. -     buPROPion (WELLBUTRIN XL) 150 MG 24 hr tablet; Take 1 tablet (150 mg total) by mouth daily.  Anxiety -     escitalopram (LEXAPRO) 20 MG tablet; Take 1 tablet (20 mg total) by mouth daily. -     buPROPion (WELLBUTRIN XL) 150 MG 24 hr tablet; Take 1 tablet (150 mg total) by mouth daily.  Primary insomnia -     traZODone (DESYREL) 50 MG tablet; TAKE 1/2 TO 1 TABLET AT BEDTIME AS NEEDED FOR SLEEP  Smoker -     buPROPion (  WELLBUTRIN XL) 150 MG 24 hr tablet; Take 1 tablet (150 mg total) by mouth daily.  Overweight -     buPROPion (WELLBUTRIN XL) 150 MG 24 hr tablet; Take 1 tablet (150 mg total) by mouth daily.  Recurrent cold sores   Refilled medications. Anxiety up some likely due to COVID. Discussed cognitive ways to decrease stress and anxiety.   Continues on wellbutrin to help her not smoke and to help with mood.   Follow up CPE in 1 year.   Follow Up Instructions:    I discussed the assessment and treatment plan with the patient. The patient was provided an opportunity to ask questions and all were answered. The patient agreed with the plan and demonstrated an understanding of the instructions.   The patient was advised to call back  or seek an in-person evaluation if the symptoms worsen or if the condition fails to improve as anticipated.  I provided 25 minutes of non-face-to-face time during this encounter.   Tandy Gaw, PA-C

## 2019-04-13 ENCOUNTER — Telehealth: Payer: Self-pay

## 2019-04-13 DIAGNOSIS — B001 Herpesviral vesicular dermatitis: Secondary | ICD-10-CM

## 2019-04-13 MED ORDER — VALACYCLOVIR HCL 500 MG PO TABS: 500.0000 mg | ORAL_TABLET | Freq: Two times a day (BID) | ORAL | 1 refills | Status: DC

## 2019-04-13 NOTE — Telephone Encounter (Signed)
Pt requesting RF on Valtrex to be sent to [facility]  RX for this last sent 02/22/17 for #180 with 1 RF   RX pended, please send if OK

## 2019-04-13 NOTE — Telephone Encounter (Signed)
Done

## 2019-05-30 DIAGNOSIS — H60331 Swimmer's ear, right ear: Secondary | ICD-10-CM | POA: Diagnosis not present

## 2019-05-31 DIAGNOSIS — H9201 Otalgia, right ear: Secondary | ICD-10-CM | POA: Diagnosis not present

## 2019-05-31 DIAGNOSIS — H60501 Unspecified acute noninfective otitis externa, right ear: Secondary | ICD-10-CM | POA: Diagnosis not present

## 2019-06-13 ENCOUNTER — Encounter: Payer: Self-pay | Admitting: Physician Assistant

## 2019-06-13 ENCOUNTER — Ambulatory Visit (INDEPENDENT_AMBULATORY_CARE_PROVIDER_SITE_OTHER): Payer: BC Managed Care – PPO | Admitting: Physician Assistant

## 2019-06-13 ENCOUNTER — Other Ambulatory Visit: Payer: Self-pay

## 2019-06-13 VITALS — BP 131/83 | HR 74 | Temp 98.9°F | Ht 66.0 in | Wt 180.0 lb

## 2019-06-13 DIAGNOSIS — E663 Overweight: Secondary | ICD-10-CM | POA: Diagnosis not present

## 2019-06-13 DIAGNOSIS — H9201 Otalgia, right ear: Secondary | ICD-10-CM | POA: Diagnosis not present

## 2019-06-13 DIAGNOSIS — H60511 Acute actinic otitis externa, right ear: Secondary | ICD-10-CM | POA: Diagnosis not present

## 2019-06-13 MED ORDER — METHYLPREDNISOLONE 4 MG PO TBPK: ORAL_TABLET | ORAL | 0 refills | Status: DC

## 2019-06-13 MED ORDER — PHENTERMINE HCL 37.5 MG PO TABS: 37.5000 mg | ORAL_TABLET | Freq: Every day | ORAL | 0 refills | Status: DC

## 2019-06-13 MED ORDER — FLUTICASONE PROPIONATE 50 MCG/ACT NA SUSP: 2.0000 | Freq: Every day | NASAL | 1 refills | Status: DC

## 2019-06-13 NOTE — Progress Notes (Signed)
   Subjective:    Patient ID: Tonya Malone, female    DOB: February 05, 1966, 53 y.o.   MRN: [id number]  HPI  Pt is a 53 yo female who presents to the clinic to follow up on right otitis externa. She went to UC on 7/22 and got cipro drops. She was not getting significant relief and went to ED on 7/23 and given augmentin. Her symptoms are much better but she still has right ear pressure, popping, congested feeling. No fever, chills, sinus pressure, headache.   She wonders about phentermine to help jump start her diet. She has done in the past. No problems or concerns.   .. Active Ambulatory Problems    Diagnosis Date Noted  . Overweight 12/02/2015  . Depression 12/02/2015  . Insomnia 12/02/2015  . Anxiety 12/02/2015  . Urticaria 07/14/2016  . Smoker 07/14/2016  . 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 RLS (restless legs syndrome) 01/26/2017  . Thyromegaly 01/06/2018  . Left thyroid nodule 01/13/2018   Resolved Ambulatory Problems    Diagnosis Date Noted  . CAP (community acquired pneumonia) 07/14/2016   No Additional Past Medical History      Review of Systems  All other systems reviewed and are negative.      Objective:   Physical Exam Vitals signs reviewed.  Constitutional:      Appearance: Normal appearance.  HENT:     Head: Normocephalic.     Right Ear: Tympanic membrane and external ear normal.     Left Ear: Tympanic membrane, ear canal and external ear normal.     Ears:     Comments: Right external canal some fine dry white powder-like substance at base of TM.     Mouth/Throat:     Mouth: Mucous membranes are moist.  Cardiovascular:     Rate and Rhythm: Normal rate and regular rhythm.  Pulmonary:     Effort: Pulmonary effort is normal.  Neurological:     General: No focal deficit present.     Mental Status: She is alert and oriented to person, place, and time.  Psychiatric:        Mood and Affect: Mood normal.        Behavior: Behavior normal.            Assessment & Plan:  Marland KitchenMarland KitchenVirdia was seen today for ear fullness.  Diagnoses and all orders for this visit:  Right ear pain -     fluticasone (FLONASE) 50 MCG/ACT nasal spray; Place 2 sprays into both nostrils daily. -     methylPREDNISolone (MEDROL DOSEPAK) 4 MG TBPK tablet; Take as directed by package insert.  Overweight (BMI 25.0-29.9) -     phentermine (ADIPEX-P) 37.5 MG tablet; Take 1 tablet (37.5 mg total) by mouth daily before breakfast.  Acute actinic otitis externa of right ear   External ear infection seems to have resolved. Can use some sweet tea tree oil for moisture in dry canal. Likely some residual ETD. Start flonase. If not improving try medrol dose pak.   [name].Discussed low carb diet with 1500 calories and 80g of protein.  Exercising at least 150 minutes a week.  My Fitness Pal could be a Microbiologist.  IF 16:8 discussed.  Phentermine for jump start.

## 2019-07-08 ENCOUNTER — Other Ambulatory Visit: Payer: Self-pay | Admitting: Physician Assistant

## 2019-07-08 DIAGNOSIS — H9201 Otalgia, right ear: Secondary | ICD-10-CM

## 2019-07-23 ENCOUNTER — Other Ambulatory Visit: Payer: Self-pay | Admitting: Physician Assistant

## 2019-07-23 DIAGNOSIS — H9201 Otalgia, right ear: Secondary | ICD-10-CM

## 2019-10-25 ENCOUNTER — Other Ambulatory Visit: Payer: Self-pay | Admitting: Physician Assistant

## 2019-10-25 DIAGNOSIS — B001 Herpesviral vesicular dermatitis: Secondary | ICD-10-CM

## 2020-01-23 ENCOUNTER — Other Ambulatory Visit: Payer: Self-pay | Admitting: Physician Assistant

## 2020-01-23 DIAGNOSIS — H9201 Otalgia, right ear: Secondary | ICD-10-CM

## 2020-02-27 DIAGNOSIS — R040 Epistaxis: Secondary | ICD-10-CM | POA: Diagnosis not present

## 2020-02-27 DIAGNOSIS — S0992XA Unspecified injury of nose, initial encounter: Secondary | ICD-10-CM | POA: Diagnosis not present

## 2020-04-02 DIAGNOSIS — J069 Acute upper respiratory infection, unspecified: Secondary | ICD-10-CM | POA: Diagnosis not present

## 2020-04-02 DIAGNOSIS — Z20828 Contact with and (suspected) exposure to other viral communicable diseases: Secondary | ICD-10-CM | POA: Diagnosis not present

## 2020-04-02 DIAGNOSIS — R070 Pain in throat: Secondary | ICD-10-CM | POA: Diagnosis not present

## 2020-04-02 DIAGNOSIS — R509 Fever, unspecified: Secondary | ICD-10-CM | POA: Diagnosis not present

## 2021-07-29 ENCOUNTER — Encounter: Payer: Self-pay | Admitting: Physician Assistant

## 2021-07-29 ENCOUNTER — Ambulatory Visit (INDEPENDENT_AMBULATORY_CARE_PROVIDER_SITE_OTHER): Payer: BC Managed Care – PPO | Admitting: Physician Assistant

## 2021-07-29 ENCOUNTER — Other Ambulatory Visit (HOSPITAL_COMMUNITY)
Admission: RE | Admit: 2021-07-29 | Discharge: 2021-07-29 | Disposition: A | Discharge status: Home / Self Care | Payer: BC Managed Care – PPO | Source: Ambulatory Visit | Attending: Physician Assistant | Admitting: Physician Assistant

## 2021-07-29 VITALS — BP 137/90 | HR 97 | Ht 66.0 in | Wt 177.0 lb

## 2021-07-29 DIAGNOSIS — Z13220 Encounter for screening for lipoid disorders: Secondary | ICD-10-CM

## 2021-07-29 DIAGNOSIS — Z1159 Encounter for screening for other viral diseases: Secondary | ICD-10-CM

## 2021-07-29 DIAGNOSIS — E010 Iodine-deficiency related diffuse (endemic) goiter: Secondary | ICD-10-CM

## 2021-07-29 DIAGNOSIS — Z124 Encounter for screening for malignant neoplasm of cervix: Secondary | ICD-10-CM | POA: Diagnosis not present

## 2021-07-29 DIAGNOSIS — Z0000 Encounter for general adult medical examination without abnormal findings: Secondary | ICD-10-CM | POA: Diagnosis not present

## 2021-07-29 DIAGNOSIS — I10 Essential (primary) hypertension: Secondary | ICD-10-CM

## 2021-07-29 DIAGNOSIS — Z131 Encounter for screening for diabetes mellitus: Secondary | ICD-10-CM

## 2021-07-29 DIAGNOSIS — B001 Herpesviral vesicular dermatitis: Secondary | ICD-10-CM

## 2021-07-29 DIAGNOSIS — Z114 Encounter for screening for human immunodeficiency virus [HIV]: Secondary | ICD-10-CM

## 2021-07-29 DIAGNOSIS — Z1231 Encounter for screening mammogram for malignant neoplasm of breast: Secondary | ICD-10-CM

## 2021-07-29 DIAGNOSIS — Z1211 Encounter for screening for malignant neoplasm of colon: Secondary | ICD-10-CM

## 2021-07-29 MED ORDER — HYDROCHLOROTHIAZIDE 12.5 MG PO TABS: 12.5000 mg | ORAL_TABLET | Freq: Every day | ORAL | 2 refills | Status: DC

## 2021-07-29 NOTE — Progress Notes (Signed)
Subjective:     Tonya Malone is a 55 y.o. female and is here for a comprehensive physical exam. The patient reports problems - she has noticed some high BP readings at home around 140/90 .no CP, palpitations, headaches or vision changes. No family hx of stroke or MI. Her brother has HTN. She is worried because her husband had a stroke 2 weeks ago.   Social History   Socioeconomic History   Marital status: Married    Spouse name: Not on file   Number of children: Not on file   Years of education: Not on file   Highest education level: Not on file  Occupational History   Not on file  Tobacco Use   Smoking status: Former    Types: Cigarettes    Quit date: 11/08/2016    Years since quitting: 4.7   Smokeless tobacco: Never  Substance and Sexual Activity   Alcohol use: Yes    Alcohol/week: 0.0 standard drinks   Drug use: No   Sexual activity: Yes  Other Topics Concern   Not on file  Social History Narrative   Not on file   Social Determinants of Health   Financial Resource Strain: Not on file  Food Insecurity: Not on file  Transportation Needs: Not on file  Physical Activity: Not on file  Stress: Not on file  Social Connections: Not on file  Intimate Partner Violence: Not on file   Health Maintenance  Topic Date Due   Hepatitis C Screening  Never done   PAP SMEAR-Modifier  Never done   MAMMOGRAM  Never done   Fecal DNA (Cologuard)  Never done   COVID-19 Vaccine (1) 08/14/2021 (Originally 05/08/1967)   Zoster Vaccines- Shingrix (1 of 2) 10/28/2021 (Originally 11/06/2016)   INFLUENZA VACCINE  02/05/2022 (Originally 06/08/2021)   TETANUS/TDAP  07/29/2022 (Originally 11/06/1985)   HIV Screening  Completed   HPV VACCINES  Aged Out    The following portions of the patient's history were reviewed and updated as appropriate: allergies, current medications, past family history, past medical history, past social history, past surgical history, and problem list.  Review of  Systems Pertinent items noted in HPI and remainder of comprehensive ROS otherwise negative.   Objective:    BP 137/90   Pulse 97   Ht 5\' 6"  (1.676 m)   Wt 177 lb (80.3 kg)   LMP  (LMP Unknown) Comment: 2 years ago  SpO2 98%   BMI 28.57 kg/m  General appearance: alert, cooperative, and appears stated age Head: Normocephalic, without obvious abnormality, atraumatic Eyes: conjunctivae/corneas clear. PERRL, EOM's intact. Fundi benign. Ears: normal TM's and external ear canals both ears Nose: Nares normal. Septum midline. Mucosa normal. No drainage or sinus tenderness. Throat: lips, mucosa, and tongue normal; teeth and gums normal Neck: no adenopathy, no carotid bruit, no JVD, supple, symmetrical, trachea midline, and thyroid not enlarged, symmetric, no tenderness/mass/nodules Back: symmetric, no curvature. ROM normal. No CVA tenderness. Lungs: clear to auscultation bilaterally Heart: regular rate and rhythm, S1, S2 normal, no murmur, click, rub or gallop Abdomen: soft, non-tender; bowel sounds normal; no masses,  no organomegaly Pelvic: cervix normal in appearance, external genitalia normal, no adnexal masses or tenderness, no cervical motion tenderness, rectovaginal septum normal, uterus normal size, shape, and consistency, and vagina normal without discharge Extremities: no ulcers, gangrene or trophic changes Pulses: 2+ and symmetric Skin: Skin color, texture, turgor normal. No rashes or lesions Lymph nodes: Cervical, supraclavicular, and axillary nodes normal. Neurologic: Grossly normal   ..  Depression screen [facility] 2/9 07/29/2021 06/13/2019 03/12/2019 09/06/2017 01/25/2017  Decreased Interest 0 0 0 2 2  Down, Depressed, Hopeless 0 0 0 1 1  PHQ - 2 Score 0 0 0 3 3  Altered sleeping 0 1 1 0 2  Tired, decreased energy 0 1 3 3 3   Change in appetite 0 2 0 0 1  Feeling bad or failure about yourself  0 0 0 0 1  Trouble concentrating 0 0 0 0 0  Moving slowly or fidgety/restless 0 0 3 0 2   Suicidal thoughts 0 0 0 0 0  PHQ-9 Score 0 4 7 6 12   Difficult doing work/chores Not difficult at all Not difficult at all Somewhat difficult - -    Assessment:    Healthy female exam.     Plan:     Jaria was seen today for annual exam.  Diagnoses and all orders for this visit:  Physical exam, routine -     TSH -     Lipid Panel w/reflex Direct LDL -     COMPLETE METABOLIC PANEL WITH GFR -     CBC with Differential/Platelet  Thyromegaly -     TSH  Diabetes mellitus screening -     COMPLETE METABOLIC PANEL WITH GFR  Lipid screening -     Lipid Panel w/reflex Direct LDL  Colon cancer screening -     Cologuard  Encounter for screening mammogram for malignant neoplasm of breast -     MM 3D SCREEN BREAST BILATERAL  Papanicolaou smear -     Cytology - PAP  Recurrent cold sores  Encounter for hepatitis C screening test for low risk patient -     Hepatitis C Antibody  Screening for HIV (human immunodeficiency virus) -     HIV antibody (with reflex)  Primary hypertension -     COMPLETE METABOLIC PANEL WITH GFR -     hydrochlorothiazide (HYDRODIURIL) 12.5 MG tablet; Take 1 tablet (12.5 mg total) by mouth daily.  .. Discussed 150 minutes of exercise a week.  Encouraged vitamin D 1000 units and Calcium 1300mg  or 4 servings of dairy a day.  PHQ no concerns.  Fasting labs ordered.  BP elevated. Discussed DASH diet and exercise. Started low dose HCTZ. Recheck in 4 weeks. Discussed side effects.  Mammogram ordered.  Pap done today.  Cologuard ordered.  Declined flu/covid/shingrix vaccines.    See After Visit Summary for Counseling Recommendations

## 2021-07-29 NOTE — Patient Instructions (Signed)
Health Maintenance, Female Adopting a healthy lifestyle and getting preventive care are important in promoting health and wellness. Ask your health care provider about: The right schedule for you to have regular tests and exams. Things you can do on your own to prevent diseases and keep yourself healthy. What should I know about diet, weight, and exercise? Eat a healthy diet  Eat a diet that includes plenty of vegetables, fruits, low-fat dairy products, and lean protein. Do not eat a lot of foods that are high in solid fats, added sugars, or sodium. Maintain a healthy weight Body mass index (BMI) is used to identify weight problems. It estimates body fat based on height and weight. Your health care provider can help determine your BMI and help you achieve or maintain a healthy weight. Get regular exercise Get regular exercise. This is one of the most important things you can do for your health. Most adults should: Exercise for at least 150 minutes each week. The exercise should increase your heart rate and make you sweat (moderate-intensity exercise). Do strengthening exercises at least twice a week. This is in addition to the moderate-intensity exercise. Spend less time sitting. Even light physical activity can be beneficial. Watch cholesterol and blood lipids Have your blood tested for lipids and cholesterol at 55 years of age, then have this test every 5 years. Have your cholesterol levels checked more often if: Your lipid or cholesterol levels are high. You are older than 55 years of age. You are at high risk for heart disease. What should I know about cancer screening? Depending on your health history and family history, you may need to have cancer screening at various ages. This may include screening for: Breast cancer. Cervical cancer. Colorectal cancer. Skin cancer. Lung cancer. What should I know about heart disease, diabetes, and high blood pressure? Blood pressure and heart  disease High blood pressure causes heart disease and increases the risk of stroke. This is more likely to develop in people who have high blood pressure readings, are of African descent, or are overweight. Have your blood pressure checked: Every 3-5 years if you are 18-39 years of age. Every year if you are 40 years old or older. Diabetes Have regular diabetes screenings. This checks your fasting blood sugar level. Have the screening done: Once every three years after age 40 if you are at a normal weight and have a low risk for diabetes. More often and at a younger age if you are overweight or have a high risk for diabetes. What should I know about preventing infection? Hepatitis B If you have a higher risk for hepatitis B, you should be screened for this virus. Talk with your health care provider to find out if you are at risk for hepatitis B infection. Hepatitis C Testing is recommended for: Everyone born from 1945 through 1965. Anyone with known risk factors for hepatitis C. Sexually transmitted infections (STIs) Get screened for STIs, including gonorrhea and chlamydia, if: You are sexually active and are younger than 55 years of age. You are older than 55 years of age and your health care provider tells you that you are at risk for this type of infection. Your sexual activity has changed since you were last screened, and you are at increased risk for chlamydia or gonorrhea. Ask your health care provider if you are at risk. Ask your health care provider about whether you are at high risk for HIV. Your health care provider may recommend a prescription medicine   to help prevent HIV infection. If you choose to take medicine to prevent HIV, you should first get tested for HIV. You should then be tested every 3 months for as long as you are taking the medicine. Pregnancy If you are about to stop having your period (premenopausal) and you may become pregnant, seek counseling before you get  pregnant. Take 400 to 800 micrograms (mcg) of folic acid every day if you become pregnant. Ask for birth control (contraception) if you want to prevent pregnancy. Osteoporosis and menopause Osteoporosis is a disease in which the bones lose minerals and strength with aging. This can result in bone fractures. If you are 65 years old or older, or if you are at risk for osteoporosis and fractures, ask your health care provider if you should: Be screened for bone loss. Take a calcium or vitamin D supplement to lower your risk of fractures. Be given hormone replacement therapy (HRT) to treat symptoms of menopause. Follow these instructions at home: Lifestyle Do not use any products that contain nicotine or tobacco, such as cigarettes, e-cigarettes, and chewing tobacco. If you need help quitting, ask your health care provider. Do not use street drugs. Do not share needles. Ask your health care provider for help if you need support or information about quitting drugs. Alcohol use Do not drink alcohol if: Your health care provider tells you not to drink. You are pregnant, may be pregnant, or are planning to become pregnant. If you drink alcohol: Limit how much you use to 0-1 drink a day. Limit intake if you are breastfeeding. Be aware of how much alcohol is in your drink. In the [place] one drink equals one 12 oz bottle of beer (355 mL), one 5 oz glass of wine (148 mL), or one 1 oz glass of hard liquor (44 mL). General instructions Schedule regular health, dental, and eye exams. Stay current with your vaccines. Tell your health care provider if: You often feel depressed. You have ever been abused or do not feel safe at home. Summary Adopting a healthy lifestyle and getting preventive care are important in promoting health and wellness. Follow your health care provider's instructions about healthy diet, exercising, and getting tested or screened for diseases. Follow your health care provider's  instructions on monitoring your cholesterol and blood pressure. This information is not intended to replace advice given to you by your health care provider. Make sure you discuss any questions you have with your health care provider. Document Revised: 01/02/2021 Document Reviewed: 10/18/2018 Elsevier Patient Education  2022 [employer]  

## 2021-07-31 LAB — CYTOLOGY - PAP
Comment: NEGATIVE
Diagnosis: NEGATIVE
High risk HPV: NEGATIVE

## 2021-07-31 NOTE — Progress Notes (Signed)
No abnormal cells and negative HPV. Follow up next pap in 5 years.

## 2021-08-13 ENCOUNTER — Ambulatory Visit (INDEPENDENT_AMBULATORY_CARE_PROVIDER_SITE_OTHER): Payer: BC Managed Care – PPO

## 2021-08-13 ENCOUNTER — Other Ambulatory Visit: Payer: Self-pay

## 2021-08-13 DIAGNOSIS — Z1159 Encounter for screening for other viral diseases: Secondary | ICD-10-CM | POA: Diagnosis not present

## 2021-08-13 DIAGNOSIS — Z1231 Encounter for screening mammogram for malignant neoplasm of breast: Secondary | ICD-10-CM

## 2021-08-13 DIAGNOSIS — Z131 Encounter for screening for diabetes mellitus: Secondary | ICD-10-CM | POA: Diagnosis not present

## 2021-08-13 DIAGNOSIS — Z13220 Encounter for screening for lipoid disorders: Secondary | ICD-10-CM | POA: Diagnosis not present

## 2021-08-13 DIAGNOSIS — E010 Iodine-deficiency related diffuse (endemic) goiter: Secondary | ICD-10-CM | POA: Diagnosis not present

## 2021-08-14 LAB — CBC WITH DIFFERENTIAL/PLATELET
Absolute Monocytes: 851 cells/uL (ref 200–950)
Basophils Absolute: 41 cells/uL (ref 0–200)
Basophils Relative: 0.9 %
Eosinophils Absolute: 83 cells/uL (ref 15–500)
Eosinophils Relative: 1.8 %
HCT: 44.3 % (ref 35.0–45.0)
Hemoglobin: 15.1 g/dL (ref 11.7–15.5)
Lymphs Abs: 1214 cells/uL (ref 850–3900)
MCH: 32.5 pg (ref 27.0–33.0)
MCHC: 34.1 g/dL (ref 32.0–36.0)
MCV: 95.3 fL (ref 80.0–100.0)
MPV: 9.2 fL (ref 7.5–12.5)
Monocytes Relative: 18.5 %
Neutro Abs: 2410 cells/uL (ref 1500–7800)
Neutrophils Relative %: 52.4 %
Platelets: 269 10*3/uL (ref 140–400)
RBC: 4.65 10*6/uL (ref 3.80–5.10)
RDW: 12.6 % (ref 11.0–15.0)
Total Lymphocyte: 26.4 %
WBC: 4.6 10*3/uL (ref 3.8–10.8)

## 2021-08-14 LAB — COMPLETE METABOLIC PANEL WITH GFR
AG Ratio: 1.8 (calc) (ref 1.0–2.5)
ALT: 40 U/L — ABNORMAL HIGH (ref 6–29)
AST: 27 U/L (ref 10–35)
Albumin: 5.1 g/dL (ref 3.6–5.1)
Alkaline phosphatase (APISO): 63 U/L (ref 37–153)
BUN: 14 mg/dL (ref 7–25)
CO2: 28 mmol/L (ref 20–32)
Calcium: 9.9 mg/dL (ref 8.6–10.4)
Chloride: 98 mmol/L (ref 98–110)
Creat: 0.77 mg/dL (ref 0.50–1.03)
Globulin: 2.9 g/dL (calc) (ref 1.9–3.7)
Glucose, Bld: 95 mg/dL (ref 65–99)
Potassium: 4.4 mmol/L (ref 3.5–5.3)
Sodium: 137 mmol/L (ref 135–146)
Total Bilirubin: 0.7 mg/dL (ref 0.2–1.2)
Total Protein: 8 g/dL (ref 6.1–8.1)
eGFR: 92 mL/min/{1.73_m2} (ref >=60)

## 2021-08-14 LAB — LIPID PANEL W/REFLEX DIRECT LDL
Cholesterol: 272 mg/dL — ABNORMAL HIGH (ref <200)
HDL: 52 mg/dL (ref >=50)
LDL Cholesterol (Calc): 178 mg/dL (calc) — ABNORMAL HIGH
Non-HDL Cholesterol (Calc): 220 mg/dL (calc) — ABNORMAL HIGH (ref <130)
Total CHOL/HDL Ratio: 5.2 (calc) — ABNORMAL HIGH (ref <5.0)
Triglycerides: 220 mg/dL — ABNORMAL HIGH (ref <150)

## 2021-08-14 LAB — HIV ANTIBODY (ROUTINE TESTING W REFLEX): HIV 1&2 Ab, 4th Generation: NONREACTIVE

## 2021-08-14 LAB — HEPATITIS C ANTIBODY
Hepatitis C Ab: NONREACTIVE
SIGNAL TO CUT-OFF: 0.02 (ref <1.00)

## 2021-08-14 LAB — TSH: TSH: 3.29 mIU/L

## 2021-08-14 NOTE — Progress Notes (Signed)
Diannia,   Normal hemoglobin and WBC.  Kidney function look good.  Glucose looks great.  Thyroid in normal range.  One liver enzyme is up some. Avoid tylenol and alcohol. Work on weight loss and regularly exercise. Recheck in 3 months.  LDL is up quite a bit.  HDL looks good.  Overall CV is is 2.4 percent with risk factors. I would advise to start cholesterol medication since above 160. You can work on low fat diet.

## 2021-08-24 NOTE — Progress Notes (Signed)
Normal mammogram. Follow up in 1 year.

## 2021-08-26 ENCOUNTER — Ambulatory Visit (INDEPENDENT_AMBULATORY_CARE_PROVIDER_SITE_OTHER): Payer: BC Managed Care – PPO | Admitting: Physician Assistant

## 2021-08-26 ENCOUNTER — Encounter: Payer: Self-pay | Admitting: Physician Assistant

## 2021-08-26 VITALS — BP 128/90 | HR 86 | Temp 98.9°F | Wt 177.1 lb

## 2021-08-26 DIAGNOSIS — Z79899 Other long term (current) drug therapy: Secondary | ICD-10-CM

## 2021-08-26 DIAGNOSIS — I10 Essential (primary) hypertension: Secondary | ICD-10-CM | POA: Diagnosis not present

## 2021-08-26 NOTE — Progress Notes (Signed)
   Subjective:    Patient ID: Tonya Malone, female    DOB: 03/02/1966, 55 y.o.   MRN: [id number]  HPI Pt is a 55 yo female with HTN who presents to the clinic for BP follow up.   She denies any problems or concerns with HCTZ. Not checking BP at home. No CP, palpitations, headaches, vision changes.     Review of Systems  All other systems reviewed and are negative.     Objective:   Physical Exam Vitals reviewed.  Constitutional:      Appearance: Normal appearance.  Cardiovascular:     Rate and Rhythm: Normal rate and regular rhythm.     Pulses: Normal pulses.     Heart sounds: Normal heart sounds.  Pulmonary:     Effort: Pulmonary effort is normal.  Neurological:     Mental Status: She is alert.  Psychiatric:        Mood and Affect: Mood normal.          Assessment & Plan:   Marland KitchenMarland KitchenReality was seen today for hypertension.  Diagnoses and all orders for this visit:  Primary hypertension -     hydrochlorothiazide (HYDRODIURIL) 12.5 MG tablet; Take 1 tablet (12.5 mg total) by mouth daily.  Medication management -     BASIC METABOLIC PANEL WITH GFR  BP much better.  Continue to work on weight loss, low salt diet and HCTZ.  Refilled HCTZ.  Cmp ordered.  Follow up in 6 months.

## 2021-08-26 NOTE — Patient Instructions (Signed)

## 2021-08-27 ENCOUNTER — Encounter: Payer: Self-pay | Admitting: Physician Assistant

## 2021-08-27 LAB — BASIC METABOLIC PANEL WITH GFR
BUN: 19 mg/dL (ref 7–25)
CO2: 31 mmol/L (ref 20–32)
Calcium: 9.9 mg/dL (ref 8.6–10.4)
Chloride: 100 mmol/L (ref 98–110)
Creat: 0.74 mg/dL (ref 0.50–1.03)
Glucose, Bld: 100 mg/dL — ABNORMAL HIGH (ref 65–99)
Potassium: 4.3 mmol/L (ref 3.5–5.3)
Sodium: 140 mmol/L (ref 135–146)
eGFR: 96 mL/min/{1.73_m2} (ref >=60)

## 2021-08-27 MED ORDER — HYDROCHLOROTHIAZIDE 12.5 MG PO TABS: 12.5000 mg | ORAL_TABLET | Freq: Every day | ORAL | 1 refills | Status: DC

## 2021-08-27 NOTE — Progress Notes (Signed)
Labs look great. Potassium stable.

## 2021-09-21 DIAGNOSIS — Z1211 Encounter for screening for malignant neoplasm of colon: Secondary | ICD-10-CM | POA: Diagnosis not present

## 2021-09-26 LAB — COLOGUARD: COLOGUARD: NEGATIVE

## 2021-09-28 NOTE — Progress Notes (Signed)
GREAT news. Cologuard negative. Follow up in 3 years.

## 2022-02-24 ENCOUNTER — Ambulatory Visit (INDEPENDENT_AMBULATORY_CARE_PROVIDER_SITE_OTHER): Payer: BLUE CROSS/BLUE SHIELD | Admitting: Physician Assistant

## 2022-02-24 ENCOUNTER — Encounter: Payer: Self-pay | Admitting: Physician Assistant

## 2022-02-24 VITALS — BP 129/89 | HR 94 | Ht 66.0 in | Wt 175.0 lb

## 2022-02-24 DIAGNOSIS — I10 Essential (primary) hypertension: Secondary | ICD-10-CM

## 2022-02-24 DIAGNOSIS — R748 Abnormal levels of other serum enzymes: Secondary | ICD-10-CM

## 2022-02-24 MED ORDER — HYDROCHLOROTHIAZIDE 25 MG PO TABS: 25.0000 mg | ORAL_TABLET | Freq: Every day | ORAL | 3 refills | Status: DC

## 2022-02-24 NOTE — Progress Notes (Signed)
? ?  Subjective:  ? ? Patient ID: Tonya Malone, female    DOB: [birth year], 55 y.o.   MRN: [id number] ? ?HPI ?Pt is a 56 yo female with HTN who presents to the clinic for refill on HcTZ. She did start taking 2 tablets of the 12.5 due to elevated BP and headaches. She feels great now. No concerns. No CP, palpitations, headaches or vision changes.  ? ?.. ?Active Ambulatory Problems  ?  Diagnosis Date Noted  ? Overweight 12/02/2015  ? Depression 12/02/2015  ? Insomnia 12/02/2015  ? Anxiety 12/02/2015  ? Urticaria 07/14/2016  ? Smoker 07/14/2016  ? Recurrent cold sores 01/26/2017  ? RLS (restless legs syndrome) 01/26/2017  ? Thyromegaly 01/06/2018  ? Left thyroid nodule 01/13/2018  ? Primary hypertension 08/26/2021  ? ?Resolved Ambulatory Problems  ?  Diagnosis Date Noted  ? CAP (community acquired pneumonia) 07/14/2016  ? ?No Additional Past Medical History  ? ? ? ? ?Review of Systems  ?All other systems reviewed and are negative. ? ?   ?Objective:  ? Physical Exam ?Vitals reviewed.  ?Constitutional:   ?   Appearance: Normal appearance.  ?HENT:  ?   Head: Normocephalic.  ?Neck:  ?   Vascular: No carotid bruit.  ?Cardiovascular:  ?   Rate and Rhythm: Normal rate and regular rhythm.  ?   Pulses: Normal pulses.  ?   Heart sounds: Normal heart sounds.  ?Pulmonary:  ?   Effort: Pulmonary effort is normal.  ?   Breath sounds: Normal breath sounds.  ?Musculoskeletal:  ?   Cervical back: Normal range of motion and neck supple. No tenderness.  ?   Right lower leg: No edema.  ?   Left lower leg: No edema.  ?Lymphadenopathy:  ?   Cervical: No cervical adenopathy.  ?Neurological:  ?   General: No focal deficit present.  ?   Mental Status: She is alert and oriented to person, place, and time.  ?Psychiatric:     ?   Mood and Affect: Mood normal.  ? ? ? ? ? ?   ?Assessment & Plan:  ?..Tonya Malone was seen today for follow-up and hypertension. ? ?Diagnoses and all orders for this visit: ? ?Primary hypertension ?-     hydrochlorothiazide  (HYDRODIURIL) 25 MG tablet; Take 1 tablet (25 mg total) by mouth daily. ? ? ?25mg  HCTZ refilled for 6 months today.  ?Vitals look great.  ?Labs and CPE at next follow up. ?Discussed shingrix and tdap. Pt will consider.  ? ? ?

## 2022-04-07 ENCOUNTER — Other Ambulatory Visit: Payer: Self-pay | Admitting: Physician Assistant

## 2022-04-07 DIAGNOSIS — I10 Essential (primary) hypertension: Secondary | ICD-10-CM

## 2022-08-26 IMAGING — MG MM DIGITAL SCREENING BILAT W/ TOMO AND CAD
8 series · 8 of 24 positions shown · non-contrast
Comparison: Previous exam(s).

CLINICAL DATA: Screening.

EXAM:
DIGITAL SCREENING BILATERAL MAMMOGRAM WITH TOMOSYNTHESIS AND CAD
TECHNIQUE: Bilateral screening digital craniocaudal and mediolateral oblique
mammograms were obtained. Bilateral screening digital breast
tomosynthesis was performed. The images were evaluated with
computer-aided detection.

[L MLO synth-2D]
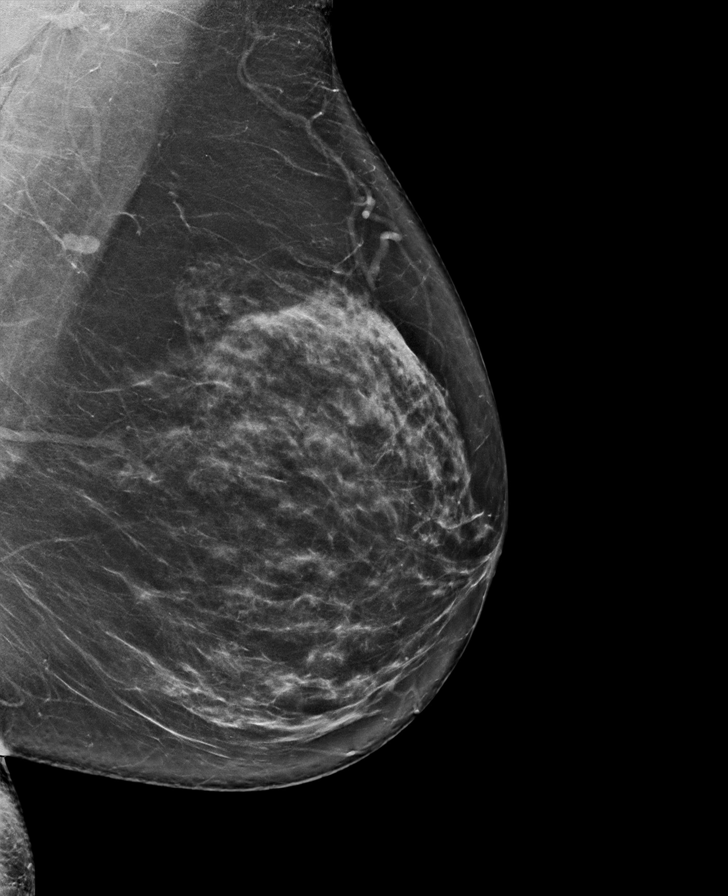

[R CC synth-2D]
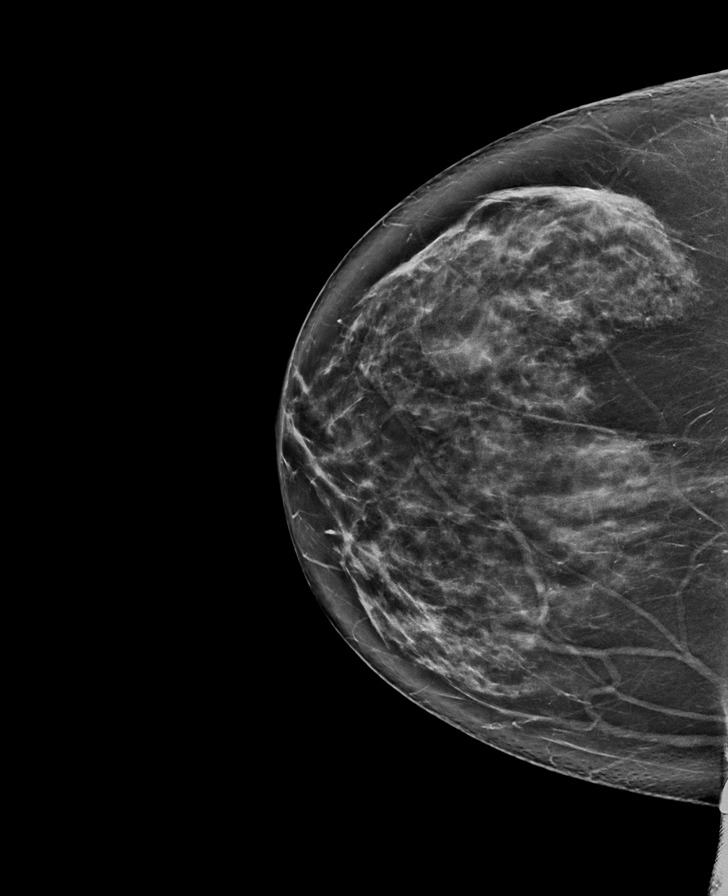

[L CC synth-2D]
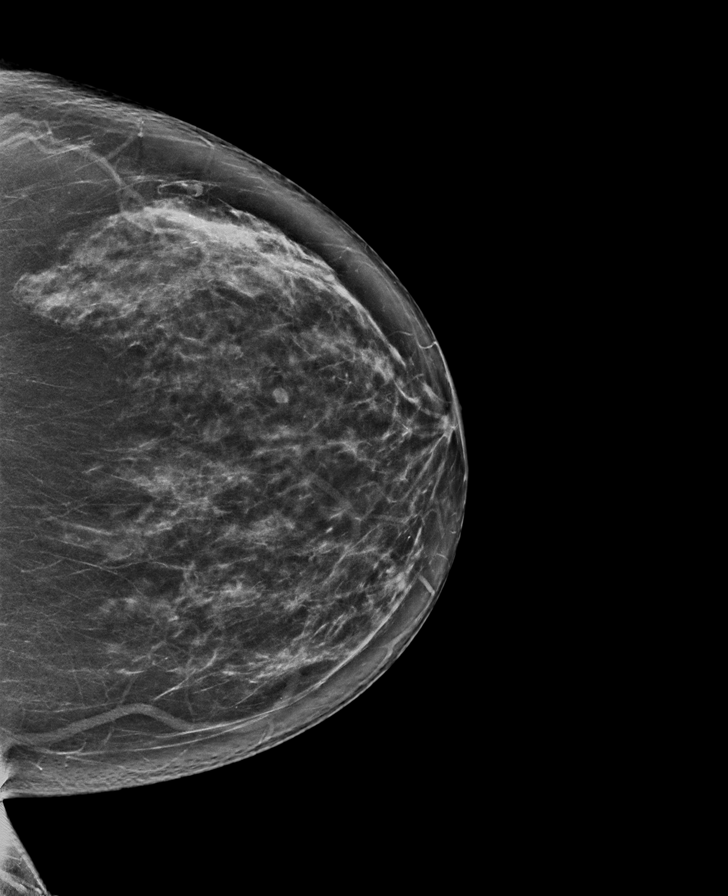

[R MLO synth-2D]
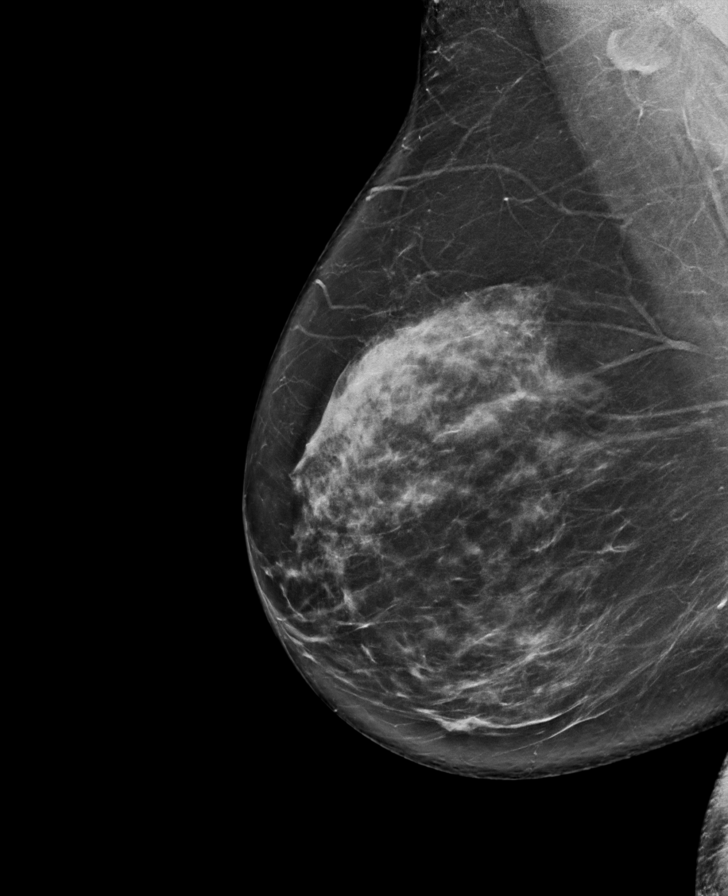

[L MLO tomo · tomo slice 44/87.0]
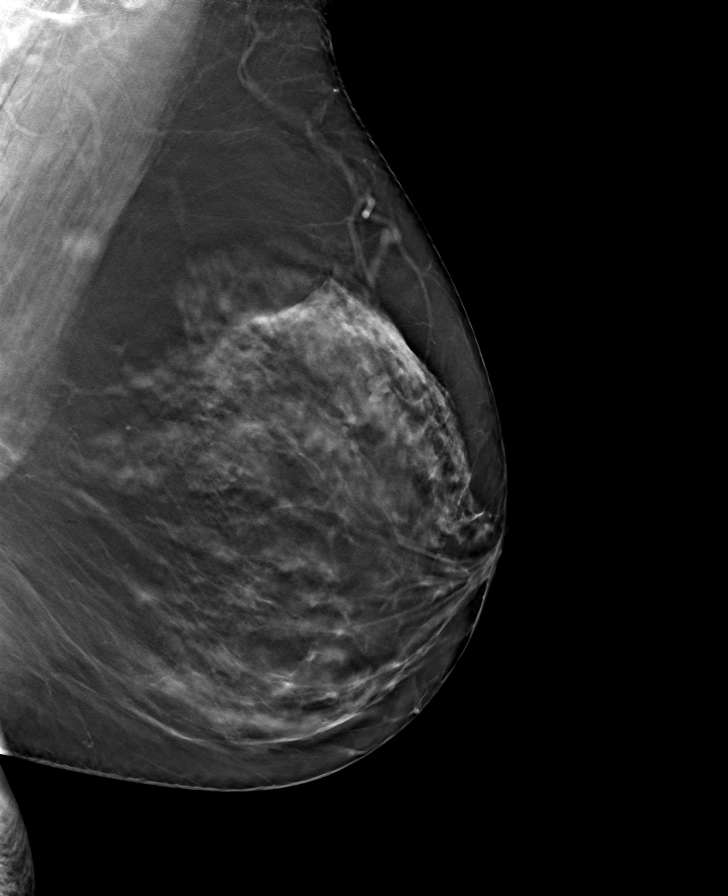

[R MLO tomo · tomo slice 45/90.0]
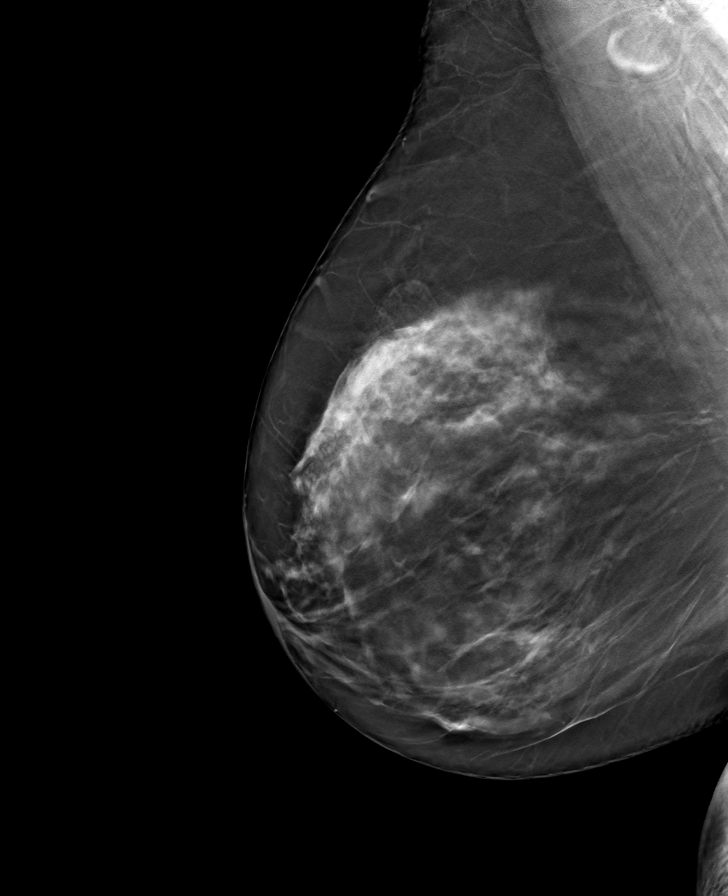

[L CC tomo · tomo slice 43/85.0]
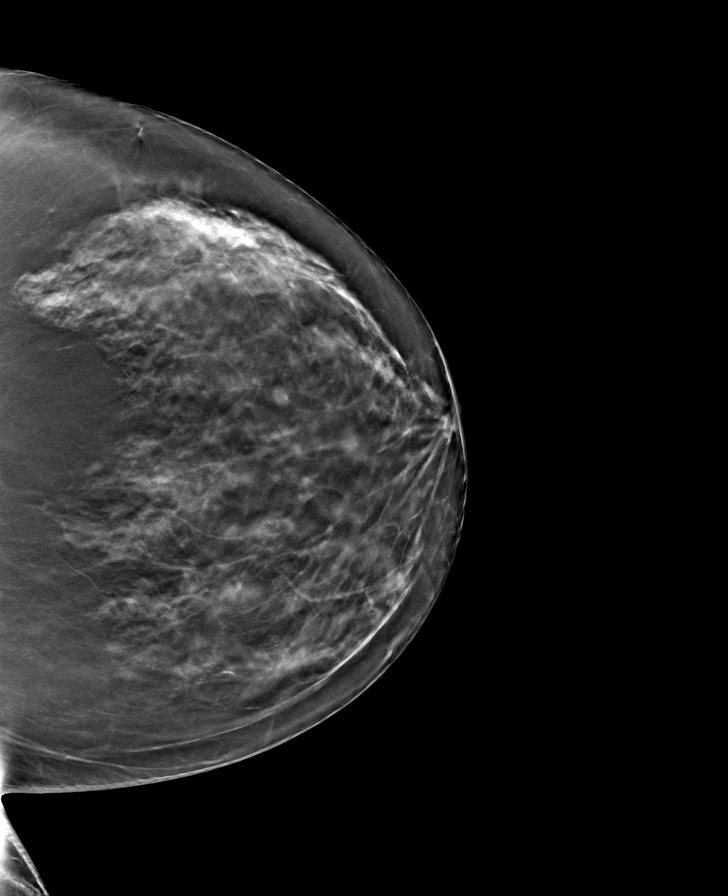

[R CC tomo · tomo slice 43/84.0]
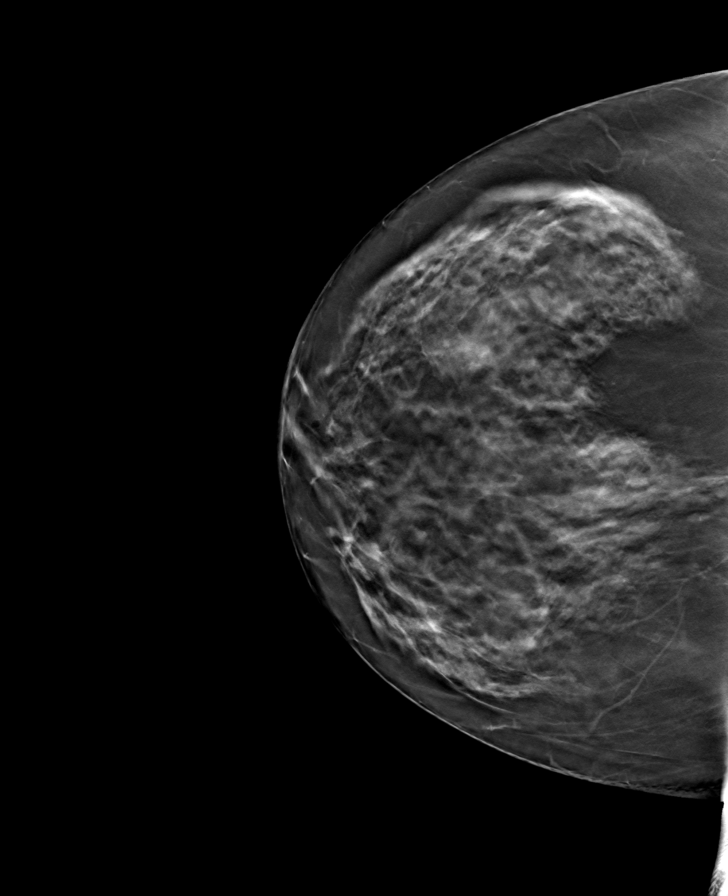

[8 of 24 positions shown; findings below may reference images not displayed]

ACR Breast Density Category c: The breast tissue is heterogeneously
dense, which may obscure small masses.
FINDINGS: There are no findings suspicious for malignancy.
IMPRESSION: No mammographic evidence of malignancy. A result letter of this
screening mammogram will be mailed directly to the patient.

RECOMMENDATION:
Screening mammogram in one year. (Code:Q3-W-BC3)

BI-RADS CATEGORY  1: Negative.

## 2022-08-27 ENCOUNTER — Encounter: Payer: BLUE CROSS/BLUE SHIELD | Admitting: Physician Assistant

## 2022-09-06 ENCOUNTER — Ambulatory Visit (INDEPENDENT_AMBULATORY_CARE_PROVIDER_SITE_OTHER): Payer: BLUE CROSS/BLUE SHIELD | Admitting: Physician Assistant

## 2022-09-06 ENCOUNTER — Encounter: Payer: Self-pay | Admitting: Physician Assistant

## 2022-09-06 VITALS — BP 136/97 | HR 79 | Ht 66.0 in | Wt 172.0 lb

## 2022-09-06 DIAGNOSIS — Z1231 Encounter for screening mammogram for malignant neoplasm of breast: Secondary | ICD-10-CM

## 2022-09-06 DIAGNOSIS — Z23 Encounter for immunization: Secondary | ICD-10-CM | POA: Diagnosis not present

## 2022-09-06 DIAGNOSIS — Z0000 Encounter for general adult medical examination without abnormal findings: Secondary | ICD-10-CM | POA: Diagnosis not present

## 2022-09-06 DIAGNOSIS — M542 Cervicalgia: Secondary | ICD-10-CM

## 2022-09-06 DIAGNOSIS — Z780 Asymptomatic menopausal state: Secondary | ICD-10-CM

## 2022-09-06 DIAGNOSIS — Z131 Encounter for screening for diabetes mellitus: Secondary | ICD-10-CM

## 2022-09-06 DIAGNOSIS — Z13220 Encounter for screening for lipoid disorders: Secondary | ICD-10-CM

## 2022-09-06 MED ORDER — CYCLOBENZAPRINE HCL 10 MG PO TABS: 10.0000 mg | ORAL_TABLET | Freq: Three times a day (TID) | ORAL | 0 refills | Status: DC | PRN

## 2022-09-06 MED ORDER — KETOROLAC TROMETHAMINE 60 MG/2ML IM SOLN
60.0000 mg | Freq: Once | INTRAMUSCULAR | Status: AC
  Administered 2022-09-06: 60 mg via INTRAMUSCULAR

## 2022-09-06 NOTE — Patient Instructions (Addendum)
Tens unit and muscle relaxer  Cervical Strain and Sprain Rehab Ask your health care provider which exercises are safe for you. Do exercises exactly as told by your health care provider and adjust them as directed. It is normal to feel mild stretching, pulling, tightness, or discomfort as you do these exercises. Stop right away if you feel sudden pain or your pain gets worse. Do not begin these exercises until told by your health care provider. Stretching and range-of-motion exercises Cervical side bending  Using good posture, sit on a stable chair or stand up. Without moving your shoulders, slowly tilt your left / right ear to your shoulder until you feel a stretch in the neck muscles on the opposite side. You should be looking straight ahead. Hold for __________ seconds. Repeat with the other side of your neck. Repeat __________ times. Complete this exercise __________ times a day. Cervical rotation  Using good posture, sit on a stable chair or stand up. Slowly turn your head to the side as if you are looking over your left / right shoulder. Keep your eyes level with the ground. Stop when you feel a stretch along the side and the back of your neck. Hold for __________ seconds. Repeat this by turning to your other side. Repeat __________ times. Complete this exercise __________ times a day. Thoracic extension and pectoral stretch  Roll a towel or a small blanket so it is about 4 inches (10 cm) in diameter. Lie down on your back on a firm surface. Put the towel in the middle of your back across your spine. It should not be under your shoulder blades. Put your hands behind your head and let your elbows fall out to your sides. Hold for __________ seconds. Repeat __________ times. Complete this exercise __________ times a day. Strengthening exercises Upper cervical flexion  Lie on your back with a thin pillow behind your head or a small, rolled-up towel under your neck. Gently tuck your  chin toward your chest and nod your head down to look toward your feet. Do not lift your head off the pillow. Hold for __________ seconds. Release the tension slowly. Relax your neck muscles completely before you repeat this exercise. Repeat __________ times. Complete this exercise __________ times a day. Cervical extension  Stand about 6 inches (15 cm) away from a wall, with your back facing the wall. Place a soft object, about 6-8 inches (15-20 cm) in diameter, between the back of your head and the wall. A soft object could be a small pillow, a ball, or a folded towel. Gently tilt your head back and press into the soft object. Keep your jaw and forehead relaxed. Hold for __________ seconds. Release the tension slowly. Relax your neck muscles completely before you repeat this exercise. Repeat __________ times. Complete this exercise __________ times a day. Posture and body mechanics Body mechanics refer to the movements and positions of your body while you do your daily activities. Posture is part of body mechanics. Good posture and healthy body mechanics can help to relieve stress in your body's tissues and joints. Good posture means that your spine is in its natural S-curve position (your spine is neutral), your shoulders are pulled back slightly, and your head is not tipped forward. The following are general guidelines for using improved posture and body mechanics in your everyday activities. Sitting  When sitting, keep your spine neutral and keep your feet flat on the floor. Use a footrest, if needed, and keep your thighs  parallel to the floor. Avoid rounding your shoulders. Avoid tilting your head forward. When working at a desk or a computer, keep your desk at a height where your hands are slightly lower than your elbows. Slide your chair under your desk so you are close enough to maintain good posture. When working at a computer, place your monitor at a height where you are looking  straight ahead and you do not have to tilt your head forward or downward to look at the screen. Standing  When standing, keep your spine neutral and keep your feet about hip-width apart. Keep a slight bend in your knees. Your ears, shoulders, and hips should line up. When you do a task in which you stand in one place for a long time, place one foot up on a stable object that is 2-4 inches (5-10 cm) high, such as a footstool. This helps keep your spine neutral. Resting When lying down and resting, avoid positions that are most painful for you. Try to support your neck in a neutral position. You can use a contour pillow or a small rolled-up towel. Your pillow should support your neck but not push on it. This information is not intended to replace advice given to you by your health care provider. Make sure you discuss any questions you have with your health care provider. Document Revised: 05/17/2022 Document Reviewed: 05/17/2022 Elsevier Patient Education  2023 [employer] Health Maintenance, Female Adopting a healthy lifestyle and getting preventive care are important in promoting health and wellness. Ask your health care provider about: The right schedule for you to have regular tests and exams. Things you can do on your own to prevent diseases and keep yourself healthy. What should I know about diet, weight, and exercise? Eat a healthy diet  Eat a diet that includes plenty of vegetables, fruits, low-fat dairy products, and lean protein. Do not eat a lot of foods that are high in solid fats, added sugars, or sodium. Maintain a healthy weight Body mass index (BMI) is used to identify weight problems. It estimates body fat based on height and weight. Your health care provider can help determine your BMI and help you achieve or maintain a healthy weight. Get regular exercise Get regular exercise. This is one of the most important things you can do for your health. Most adults should: Exercise  for at least 150 minutes each week. The exercise should increase your heart rate and make you sweat (moderate-intensity exercise). Do strengthening exercises at least twice a week. This is in addition to the moderate-intensity exercise. Spend less time sitting. Even light physical activity can be beneficial. Watch cholesterol and blood lipids Have your blood tested for lipids and cholesterol at 56 years of age, then have this test every 5 years. Have your cholesterol levels checked more often if: Your lipid or cholesterol levels are high. You are older than 56 years of age. You are at high risk for heart disease. What should I know about cancer screening? Depending on your health history and family history, you may need to have cancer screening at various ages. This may include screening for: Breast cancer. Cervical cancer. Colorectal cancer. Skin cancer. Lung cancer. What should I know about heart disease, diabetes, and high blood pressure? Blood pressure and heart disease High blood pressure causes heart disease and increases the risk of stroke. This is more likely to develop in people who have high blood pressure readings or are overweight. Have your blood pressure checked: Every  3-5 years if you are 31-68 years of age. Every year if you are 59 years old or older. Diabetes Have regular diabetes screenings. This checks your fasting blood sugar level. Have the screening done: Once every three years after age 24 if you are at a normal weight and have a low risk for diabetes. More often and at a younger age if you are overweight or have a high risk for diabetes. What should I know about preventing infection? Hepatitis B If you have a higher risk for hepatitis B, you should be screened for this virus. Talk with your health care provider to find out if you are at risk for hepatitis B infection. Hepatitis C Testing is recommended for: Everyone born from [birth year] through 1965. Anyone with  known risk factors for hepatitis C. Sexually transmitted infections (STIs) Get screened for STIs, including gonorrhea and chlamydia, if: You are sexually active and are younger than 56 years of age. You are older than 56 years of age and your health care provider tells you that you are at risk for this type of infection. Your sexual activity has changed since you were last screened, and you are at increased risk for chlamydia or gonorrhea. Ask your health care provider if you are at risk. Ask your health care provider about whether you are at high risk for HIV. Your health care provider may recommend a prescription medicine to help prevent HIV infection. If you choose to take medicine to prevent HIV, you should first get tested for HIV. You should then be tested every 3 months for as long as you are taking the medicine. Pregnancy If you are about to stop having your period (premenopausal) and you may become pregnant, seek counseling before you get pregnant. Take 400 to 800 micrograms (mcg) of folic acid every day if you become pregnant. Ask for birth control (contraception) if you want to prevent pregnancy. Osteoporosis and menopause Osteoporosis is a disease in which the bones lose minerals and strength with aging. This can result in bone fractures. If you are 72 years old or older, or if you are at risk for osteoporosis and fractures, ask your health care provider if you should: Be screened for bone loss. Take a calcium or vitamin D supplement to lower your risk of fractures. Be given hormone replacement therapy (HRT) to treat symptoms of menopause. Follow these instructions at home: Alcohol use Do not drink alcohol if: Your health care provider tells you not to drink. You are pregnant, may be pregnant, or are planning to become pregnant. If you drink alcohol: Limit how much you have to: 0-1 drink a day. Know how much alcohol is in your drink. In the [place] one drink equals one 12 oz bottle  of beer (355 mL), one 5 oz glass of wine (148 mL), or one 1 oz glass of hard liquor (44 mL). Lifestyle Do not use any products that contain nicotine or tobacco. These products include cigarettes, chewing tobacco, and vaping devices, such as e-cigarettes. If you need help quitting, ask your health care provider. Do not use street drugs. Do not share needles. Ask your health care provider for help if you need support or information about quitting drugs. General instructions Schedule regular health, dental, and eye exams. Stay current with your vaccines. Tell your health care provider if: You often feel depressed. You have ever been abused or do not feel safe at home. Summary Adopting a healthy lifestyle and getting preventive care are important in promoting  health and wellness. Follow your health care provider's instructions about healthy diet, exercising, and getting tested or screened for diseases. Follow your health care provider's instructions on monitoring your cholesterol and blood pressure. This information is not intended to replace advice given to you by your health care provider. Make sure you discuss any questions you have with your health care provider. Document Revised: 03/16/2021 Document Reviewed: 03/16/2021 Elsevier Patient Education  2023 [employer].

## 2022-09-06 NOTE — Progress Notes (Signed)
Subjective:     Tonya Malone is a 56 y.o. female and is here for a comprehensive physical exam. The patient reports problems - she is having left sided neck and muscle pain from sleeping off a pillow on Saturday night. No injury or radiation of pain down arm. Taking NSAIDs and using icy hot patches.  .  Social History   Socioeconomic History   Marital status: Married    Spouse name: Not on file   Number of children: Not on file   Years of education: Not on file   Highest education level: Not on file  Occupational History   Not on file  Tobacco Use   Smoking status: Former    Types: Cigarettes    Quit date: 11/08/2016    Years since quitting: 5.8   Smokeless tobacco: Never  Substance and Sexual Activity   Alcohol use: Yes    Alcohol/week: 0.0 standard drinks of alcohol   Drug use: No   Sexual activity: Yes  Other Topics Concern   Not on file  Social History Narrative   Not on file   Social Determinants of Health   Financial Resource Strain: Not on file  Food Insecurity: Not on file  Transportation Needs: Not on file  Physical Activity: Not on file  Stress: Not on file  Social Connections: Not on file  Intimate Partner Violence: Not on file   Health Maintenance  Topic Date Due   MAMMOGRAM  08/13/2022   COVID-19 Vaccine (1) 09/22/2022 (Originally 11/07/1971)   TETANUS/TDAP  09/07/2023 (Originally 11/06/1985)   Zoster Vaccines- Shingrix (2 of 2) 11/01/2022   Fecal DNA (Cologuard)  09/21/2024   PAP SMEAR-Modifier  07/29/2026   INFLUENZA VACCINE  Completed   Hepatitis C Screening  Completed   HIV Screening  Completed   HPV VACCINES  Aged Out    The following portions of the patient's history were reviewed and updated as appropriate: allergies, current medications, past family history, past medical history, past social history, past surgical history, and problem list.  Review of Systems A comprehensive review of systems was negative.   Objective:  Marland KitchenMarland Kitchen     09/06/2022    9:07 AM 02/24/2022    8:36 AM 07/29/2021   10:21 AM 06/13/2019   10:12 AM 03/12/2019    4:17 PM  Depression screen PHQ 2/9  Decreased Interest 0 1 0 0 0  Down, Depressed, Hopeless 0 0 0 0 0  PHQ - 2 Score 0 1 0 0 0  Altered sleeping 0  0 1 1  Tired, decreased energy 0  0 1 3  Change in appetite 0  0 2 0  Feeling bad or failure about yourself  0  0 0 0  Trouble concentrating 0  0 0 0  Moving slowly or fidgety/restless 0  0 0 3  Suicidal thoughts 0  0 0 0  PHQ-9 Score 0  0 4 7  Difficult doing work/chores Not difficult at all  Not difficult at all Not difficult at all Somewhat difficult   .[name]    09/06/2022    9:07 AM 07/29/2021   10:21 AM 06/13/2019   10:12 AM 03/12/2019    4:19 PM  GAD 7 : Generalized Anxiety Score  Nervous, Anxious, on Edge 1 1 0 3  Control/stop worrying 0 0 0 3  Worry too much - different things 1 1 0 3  Trouble relaxing 0 0 0 1  Restless 0 0 0 0  Easily annoyed or  irritable 0 0 0 3  Afraid - awful might happen 0 0 0 0  Total GAD 7 Score 2 2 0 13  Anxiety Difficulty Not difficult at all Not difficult at all Not difficult at all Somewhat difficult      BP (!) 136/97   Pulse 79   Ht 5\' 6"  (1.676 m)   Wt 172 lb (78 kg)   LMP  (LMP Unknown) Comment: 2 years ago  SpO2 98%   BMI 27.76 kg/m  General appearance: alert, cooperative, and appears stated age Head: Normocephalic, without obvious abnormality, atraumatic Eyes: conjunctivae/corneas clear. PERRL, EOM's intact. Fundi benign. Ears: normal TM's and external ear canals both ears Nose: Nares normal. Septum midline. Mucosa normal. No drainage or sinus tenderness. Throat: lips, mucosa, and tongue normal; teeth and gums normal Neck: no adenopathy, no carotid bruit, no JVD, supple, symmetrical, trachea midline, and thyroid not enlarged, symmetric, no tenderness/mass/nodules decreased ROM to the left and right due to pain tight left sided trapezius muscles with tenderness Back: symmetric, no curvature.  ROM normal. No CVA tenderness. Lungs: clear to auscultation bilaterally  Heart: regular rate and rhythm, S1, S2 normal, no murmur, click, rub or gallop Abdomen: soft, non-tender; bowel sounds normal; no masses,  no organomegaly Extremities: extremities normal, atraumatic, no cyanosis or edema Pulses: 2+ and symmetric Skin: Skin color, texture, turgor normal. No rashes or lesions Lymph nodes: Cervical, supraclavicular, and axillary nodes normal. Neurologic: Grossly normal    Assessment:    Healthy female exam.     Plan:  Marland KitchenMarland KitchenTristyn was seen today for annual exam.  Diagnoses and all orders for this visit:  Routine physical examination -     Lipid Panel w/reflex Direct LDL -     COMPLETE METABOLIC PANEL WITH GFR -     TSH -     CBC w/Diff/Platelet -     VITAMIN D 25 Hydroxy (Vit-D Deficiency, Fractures) -     B12 and Folate Panel  Visit for screening mammogram -     MM 3D SCREEN BREAST BILATERAL  Neck pain -     cyclobenzaprine (FLEXERIL) 10 MG tablet; Take 1 tablet (10 mg total) by mouth 3 (three) times daily as needed for muscle spasms. -     ketorolac (TORADOL) injection 60 mg  Screening for diabetes mellitus -     COMPLETE METABOLIC PANEL WITH GFR  Screening for lipid disorders -     Lipid Panel w/reflex Direct LDL  Post-menopausal -     VITAMIN D 25 Hydroxy (Vit-D Deficiency, Fractures) -     B12 and Folate Panel  Flu vaccine need -     Flu Vaccine QUAD 42mo+IM (Fluarix, Fluzone & Alfiuria Quad PF)  Need for shingles vaccine -     Varicella-zoster vaccine IM  .[name] Discussed 150 minutes of exercise a week.  Encouraged vitamin D 1000 units and Calcium 1300mg  or 4 servings of dairy a day.  PHQ no concerns Fasting labs ordered today Mammogram ordered today  Pap UTD Cologuard UTD BP a little elevated today but she is in some pain, continue HCTZ Flu and shingles vaccine given today Pt will consider tdap at another time  Discussed neck pain Gave  exercises Continue icy hot and tens unit Flexeril given to use as needed, warned about sedation Toradol IM given today in office Follow up as needed  Follow up in 6 months    See After Visit Summary for Counseling Recommendations

## 2022-09-07 ENCOUNTER — Encounter: Payer: Self-pay | Admitting: Physician Assistant

## 2022-09-07 DIAGNOSIS — E7800 Pure hypercholesterolemia, unspecified: Secondary | ICD-10-CM | POA: Insufficient documentation

## 2022-09-07 NOTE — Progress Notes (Signed)
Tonya Malone,   Vitamin D low normal. Make sure taking at least 2000 units a day with dairy for better absorption.  B12 and folate good.  TSH normal and stable from 1 year ago.  Kidney function looks good.  Glucose is just a little elevated. Lets add a1c to evaluate average sugars.  Calcium just a little elevated. Lets see if increasing vitamin D will bring that back down. Recheck PTH/calcium/vitamin D in 3 months.   LDL down a little from 1 year ago.  HDL looks great.   Overall risk under 7.5. continue to work on diet and exercise. Recheck in 1 year.    [name].The 10-year ASCVD risk score (Arnett DK, et al., 2019) is: 3.5%   Values used to calculate the score:     Age: 56 years     Sex: Female     Is Non-Hispanic African American: No     Diabetic: No     Tobacco smoker: No     Systolic Blood Pressure: 818 mmHg     Is BP treated: Yes     HDL Cholesterol: 62 mg/dL     Total Cholesterol: 256 mg/dL

## 2022-09-09 ENCOUNTER — Ambulatory Visit (INDEPENDENT_AMBULATORY_CARE_PROVIDER_SITE_OTHER): Payer: BLUE CROSS/BLUE SHIELD

## 2022-09-09 DIAGNOSIS — Z1231 Encounter for screening mammogram for malignant neoplasm of breast: Secondary | ICD-10-CM | POA: Diagnosis not present

## 2022-09-09 LAB — CBC WITH DIFFERENTIAL/PLATELET
Absolute Monocytes: 562 cells/uL (ref 200–950)
Basophils Absolute: 51 cells/uL (ref 0–200)
Basophils Relative: 1.3 %
Eosinophils Absolute: 109 cells/uL (ref 15–500)
Eosinophils Relative: 2.8 %
HCT: 43.8 % (ref 35.0–45.0)
Hemoglobin: 15.5 g/dL (ref 11.7–15.5)
Lymphs Abs: 1119 cells/uL (ref 850–3900)
MCH: 34.7 pg — ABNORMAL HIGH (ref 27.0–33.0)
MCHC: 35.4 g/dL (ref 32.0–36.0)
MCV: 98 fL (ref 80.0–100.0)
MPV: 9.3 fL (ref 7.5–12.5)
Monocytes Relative: 14.4 %
Neutro Abs: 2059 cells/uL (ref 1500–7800)
Neutrophils Relative %: 52.8 %
Platelets: 260 10*3/uL (ref 140–400)
RBC: 4.47 10*6/uL (ref 3.80–5.10)
RDW: 12 % (ref 11.0–15.0)
Total Lymphocyte: 28.7 %
WBC: 3.9 10*3/uL (ref 3.8–10.8)

## 2022-09-09 LAB — LIPID PANEL W/REFLEX DIRECT LDL
Cholesterol: 256 mg/dL — ABNORMAL HIGH (ref <200)
HDL: 62 mg/dL (ref >=50)
LDL Cholesterol (Calc): 168 mg/dL (calc) — ABNORMAL HIGH
Non-HDL Cholesterol (Calc): 194 mg/dL (calc) — ABNORMAL HIGH (ref <130)
Total CHOL/HDL Ratio: 4.1 (calc) (ref <5.0)
Triglycerides: 129 mg/dL (ref <150)

## 2022-09-09 LAB — COMPLETE METABOLIC PANEL WITH GFR
AG Ratio: 1.9 (calc) (ref 1.0–2.5)
ALT: 28 U/L (ref 6–29)
AST: 19 U/L (ref 10–35)
Albumin: 4.7 g/dL (ref 3.6–5.1)
Alkaline phosphatase (APISO): 55 U/L (ref 37–153)
BUN: 16 mg/dL (ref 7–25)
CO2: 28 mmol/L (ref 20–32)
Calcium: 10.8 mg/dL — ABNORMAL HIGH (ref 8.6–10.4)
Chloride: 99 mmol/L (ref 98–110)
Creat: 0.78 mg/dL (ref 0.50–1.03)
Globulin: 2.5 g/dL (calc) (ref 1.9–3.7)
Glucose, Bld: 101 mg/dL — ABNORMAL HIGH (ref 65–99)
Potassium: 4.2 mmol/L (ref 3.5–5.3)
Sodium: 140 mmol/L (ref 135–146)
Total Bilirubin: 0.6 mg/dL (ref 0.2–1.2)
Total Protein: 7.2 g/dL (ref 6.1–8.1)
eGFR: 90 mL/min/{1.73_m2} (ref >=60)

## 2022-09-09 LAB — HEMOGLOBIN A1C W/OUT EAG: Hgb A1c MFr Bld: 5.1 % of total Hgb (ref <5.7)

## 2022-09-09 LAB — VITAMIN D 25 HYDROXY (VIT D DEFICIENCY, FRACTURES): Vit D, 25-Hydroxy: 35 ng/mL (ref 30–100)

## 2022-09-09 LAB — TSH: TSH: 3.55 mIU/L

## 2022-09-09 LAB — B12 AND FOLATE PANEL
Folate: 7.7 ng/mL
Vitamin B-12: 408 pg/mL (ref 200–1100)

## 2022-09-09 NOTE — Progress Notes (Signed)
A1C looks good. No diabetes.

## 2022-09-13 NOTE — Progress Notes (Signed)
Normal mammogram. Follow up in 1 year.

## 2023-03-14 ENCOUNTER — Other Ambulatory Visit: Payer: Self-pay | Admitting: Physician Assistant

## 2023-03-14 DIAGNOSIS — I10 Essential (primary) hypertension: Secondary | ICD-10-CM

## 2023-03-15 NOTE — Telephone Encounter (Signed)
Patient scheduled on 03/21/2023 for hydrochlorothiazide (HYDRODIURIL) follow up, Thanks.

## 2023-03-21 ENCOUNTER — Ambulatory Visit (INDEPENDENT_AMBULATORY_CARE_PROVIDER_SITE_OTHER): Payer: 59 | Admitting: Physician Assistant

## 2023-03-21 ENCOUNTER — Encounter: Payer: Self-pay | Admitting: Physician Assistant

## 2023-03-21 VITALS — BP 128/86 | HR 92 | Ht 66.0 in | Wt 171.0 lb

## 2023-03-21 DIAGNOSIS — I10 Essential (primary) hypertension: Secondary | ICD-10-CM | POA: Diagnosis not present

## 2023-03-21 DIAGNOSIS — Z23 Encounter for immunization: Secondary | ICD-10-CM | POA: Diagnosis not present

## 2023-03-21 MED ORDER — HYDROCHLOROTHIAZIDE 25 MG PO TABS: 25.0000 mg | ORAL_TABLET | Freq: Every day | ORAL | 1 refills | Status: DC

## 2023-03-21 NOTE — Progress Notes (Signed)
   Established Patient Office Visit  Subjective   Patient ID: Tonya Malone, female    DOB: December 04, 1965  Age: 57 y.o. MRN: [id number]  Chief Complaint  Patient presents with   Follow-up    HPI Pt is a 57 yo female with HTN who presents to the clinic for 6 month follow up. She is doing well on HCTZ. No concerns. Denies any CP, palpitations, headaches or vision changes. She is active and keeps to a healthy diet. She is not checking BP at home.   .. 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     Objective:     BP 128/86   Pulse 92   Ht 5\' 6"  (1.676 m)   Wt 171 lb (77.6 kg)   LMP  (LMP Unknown) Comment: 2 years ago  SpO2 99%   BMI 27.60 kg/m  BP Readings from Last 3 Encounters:  03/21/23 128/86  09/06/22 (!) 136/97  02/24/22 129/89   Wt Readings from Last 3 Encounters:  03/21/23 171 lb (77.6 kg)  09/06/22 172 lb (78 kg)  02/24/22 175 lb (79.4 kg)      Physical Exam Constitutional:      Appearance: Normal appearance.  HENT:     Head: Normocephalic.  Cardiovascular:     Rate and Rhythm: Normal rate and regular rhythm.  Pulmonary:     Effort: Pulmonary effort is normal.     Breath sounds: Normal breath sounds.  Neurological:     Mental Status: She is alert.  Psychiatric:        Mood and Affect: Mood normal.      The 10-year ASCVD risk score (Arnett DK, et  al., 2019) is: 3.4%    Assessment & Plan:  Marland KitchenMarland KitchenDrina was seen today for follow-up.  Diagnoses and all orders for this visit:  Primary hypertension -     hydrochlorothiazide (HYDRODIURIL) 25 MG tablet; Take 1 tablet (25 mg total) by mouth daily.  Need for shingles vaccine -     Zoster Recombinant (Shingrix )  Need for Tdap vaccination -     Tdap vaccine greater than or equal to 7yo IM   2nd BP much better Refilled HCTZ Shingles/Tdap given today Follow up CPE in 6 months  Return in about 6 months (around 09/21/2023) for CPE.    Tandy Gaw, PA-C

## 2023-09-21 ENCOUNTER — Ambulatory Visit: Payer: Self-pay | Admitting: Physician Assistant

## 2023-09-21 ENCOUNTER — Encounter: Payer: Self-pay | Admitting: Physician Assistant

## 2023-09-21 VITALS — BP 140/84 | HR 86 | Ht 66.0 in | Wt 170.0 lb

## 2023-09-21 DIAGNOSIS — I10 Essential (primary) hypertension: Secondary | ICD-10-CM

## 2023-09-21 DIAGNOSIS — Z1231 Encounter for screening mammogram for malignant neoplasm of breast: Secondary | ICD-10-CM

## 2023-09-21 DIAGNOSIS — Z789 Other specified health status: Secondary | ICD-10-CM | POA: Insufficient documentation

## 2023-09-21 DIAGNOSIS — B001 Herpesviral vesicular dermatitis: Secondary | ICD-10-CM

## 2023-09-21 MED ORDER — VALACYCLOVIR HCL 500 MG PO TABS: 500.0000 mg | ORAL_TABLET | Freq: Two times a day (BID) | ORAL | 3 refills | Status: DC

## 2023-09-21 MED ORDER — LOSARTAN POTASSIUM-HCTZ 50-12.5 MG PO TABS: 1.0000 | ORAL_TABLET | Freq: Every day | ORAL | 0 refills | Status: DC

## 2023-09-21 NOTE — Progress Notes (Signed)
Established Patient Office Visit  Subjective   Patient ID: Tonya Malone, female    DOB: [birth year]  Age: 57 y.o. MRN: [id number]  Chief Complaint  Patient presents with   Medical Management of Chronic Issues    6 mo fup  rx refills     HPI Patient presents today for 6 mo HTN f/u. She does not take at home BPs. She takes hydrochlorothiazide at night. She reports drinking more alcohol. She reports HAs sometimes. Denies CP,  SOB, blurry vision, LE swelling. Diet is low carb with protein and vegetables. Is not exercising.   Due for mammogram and wants a referral.   Need refills for hydrochlorothiazide and valtrex. She takes valtrex for cold sore suppression therapy.   She did lose her insurance and self pay so she request to do as little intervention as we can.   .. 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   Cardiovascular:  Negative for chest pain and leg swelling.  Neurological:  Positive for headaches.      Objective:     BP (!) 140/84 (BP Location: Left Arm)   Pulse 86   Ht 5\' 6"  (1.676 m)   Wt 170 lb (77.1 kg)   LMP  (LMP Unknown) Comment: 2 years ago  SpO2 99%   BMI 27.44 kg/m  BP Readings from Last 3 Encounters:  09/21/23 (!) 140/84  03/21/23 128/86  09/06/22 (!) 136/97      Physical Exam Cardiovascular:     Rate and  Rhythm: Normal rate and regular rhythm.     Pulses: Normal pulses.     Heart sounds: Normal heart sounds.  Pulmonary:     Effort: Pulmonary effort is normal.     Breath sounds: Normal breath sounds.  Musculoskeletal:     Right lower leg: No edema.     Left lower leg: No edema.       Assessment & Plan:  Marland KitchenMarland KitchenMasiah was seen today for medical management of chronic issues.  Diagnoses and all orders for this visit:  Primary hypertension -     losartan-hydrochlorothiazide (HYZAAR) 50-12.5 MG tablet; Take 1 tablet by mouth daily. -     CMP14+EGFR  Recurrent cold sores -     valACYclovir (VALTREX) 500 MG tablet; Take 1 tablet (500 mg total) by mouth 2 (two) times daily.  Visit for screening mammogram -     MM 3D SCREENING MAMMOGRAM BILATERAL BREAST  Alcohol use   BP not to goal Added losartan to hydrochlorothiazide Advised pt to take at home BPs. Goal for BP: <130/80. Discussed cutting back on alcohol and other lifestyle changes that could help lower BP.  Will recheck kidney function today.  Nurse visit in 2 weeks.  Follow up in office in 3 months.   Strongly encouraged mammogram and to reach out and communicate self pay  status to get discount.   Return in about 3 months (around 12/22/2023), or if symptoms worsen or fail to improve, for 2 week nurse visit for BP check.    Tandy Gaw, PA-C

## 2023-12-20 ENCOUNTER — Other Ambulatory Visit: Payer: Self-pay | Admitting: Physician Assistant

## 2023-12-20 DIAGNOSIS — I10 Essential (primary) hypertension: Secondary | ICD-10-CM

## 2023-12-20 NOTE — Telephone Encounter (Signed)
Copied from CRM [id number]. Topic: Clinical - Medication Refill >> Dec 20, 2023  2:55 PM Clayton Bibles wrote: Most Recent Primary Care Visit:  Provider: Jomarie Longs  Department: [facility]  Visit Type: OFFICE VISIT  Date: 09/21/2023  Medication: losartan-hydrochlorothiazide (HYZAAR) 50-12.5 MG  Has the patient contacted their pharmacy? No (Agent: If no, request that the patient contact the pharmacy for the refill. If patient does not wish to contact the pharmacy document the reason why and proceed with request.) (Agent: If yes, when and what did the pharmacy advise?) Needs doctor's order  Is this the correct pharmacy for this prescription? Yes - [facility] If no, delete pharmacy and type the correct one.  This is the patient's preferred pharmacy:  [id number] - [place], [place] - [address] [place] [address] Phone: [phone] Fax: [phone]   Has the prescription been filled recently? No  Is the patient out of the medication? Yes  Has the patient been seen for an appointment in the last year OR does the patient have an upcoming appointment? Yes - She has an appointment for February 18. She changed the appointment that was on February 14 due to going out of town for her 25th wedding anniversary   Can we respond through MyChart? Yes - Please send her a note  Agent: Please be advised that Rx refills may take up to 3 business days. We ask that you follow-up with your pharmacy.

## 2023-12-23 ENCOUNTER — Ambulatory Visit: Payer: Self-pay | Admitting: Physician Assistant

## 2023-12-27 ENCOUNTER — Encounter: Payer: Self-pay | Admitting: Physician Assistant

## 2023-12-27 ENCOUNTER — Ambulatory Visit: Payer: Self-pay | Admitting: Physician Assistant

## 2023-12-27 VITALS — BP 148/90 | Ht 66.0 in | Wt 170.0 lb

## 2023-12-27 DIAGNOSIS — Z1231 Encounter for screening mammogram for malignant neoplasm of breast: Secondary | ICD-10-CM

## 2023-12-27 DIAGNOSIS — I10 Essential (primary) hypertension: Secondary | ICD-10-CM

## 2023-12-27 DIAGNOSIS — J069 Acute upper respiratory infection, unspecified: Secondary | ICD-10-CM

## 2023-12-27 MED ORDER — BENZONATATE 200 MG PO CAPS: 200.0000 mg | ORAL_CAPSULE | Freq: Three times a day (TID) | ORAL | 0 refills | Status: DC | PRN

## 2023-12-27 MED ORDER — LOSARTAN POTASSIUM-HCTZ 100-12.5 MG PO TABS
1.0000 | ORAL_TABLET | Freq: Every day | ORAL | 1 refills | Status: DC
Start: 2023-12-27 — End: 2024-06-25

## 2023-12-27 NOTE — Patient Instructions (Signed)

## 2023-12-27 NOTE — Progress Notes (Signed)
Established Patient Office Visit  Subjective   Patient ID: Tonya Malone, female    DOB: 10-13-1966  Age: 58 y.o. MRN: [id number]   HPI Patient is a 58 yo female who presents for follow up for high blood pressure. She is tracking her BP at home and they have been running high: 135/100, 150/80. She has been taking her blood pressure medications daily. She complains of headaches a couple times a week. She is also under increased stress at work. She denies chest pain, shortness of breath, and palpitations. She is not exercising. She is compliant with medications.   Pt has had URI symptoms for 5 days. She feels like she is getting better. She is taking dayquil and nyquil. No fever, chills, body aches. She does having a lingering dry cough.  .. 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   HENT:  Positive for congestion.   Respiratory:  Positive for cough.   Cardiovascular:  Negative for chest pain and palpitations.  Gastrointestinal: Negative.   Genitourinary: Negative.   Musculoskeletal: Negative.   Skin: Negative.   Neurological:  Positive for headaches. Negative for dizziness.  Endo/Heme/Allergies: Negative.   Psychiatric/Behavioral: Negative.       Objective:     BP (!) 148/90   Ht 5\' 6"  (1.676 m)   Wt 170 lb (77.1 kg)   LMP  (LMP Unknown) Comment: 2 years ago   BMI 27.44 kg/m  BP Readings from Last 3 Encounters:  12/27/23 (!) 148/90  09/21/23 (!) 140/84  03/21/23 128/86   Wt Readings from Last 3 Encounters:  12/27/23 170 lb (77.1 kg)  09/21/23 170 lb (77.1 kg)  03/21/23 171 lb (77.6 kg)      Physical Exam Constitutional:      Appearance: Normal appearance.  HENT:     Head: Normocephalic and atraumatic.  Cardiovascular:     Rate and Rhythm: Normal rate and regular rhythm.     Pulses: Normal pulses.     Heart sounds: Normal heart sounds.  Pulmonary:     Effort: Pulmonary effort is normal.     Breath sounds: Normal breath sounds.  Musculoskeletal:        General: Normal range of motion.     Cervical back: Normal range of motion.  Skin:    General: Skin is warm.  Neurological:     Mental Status: She is alert.  Psychiatric:        Mood and Affect: Mood normal.     The 10-year ASCVD risk score (Arnett DK, et al., 2019) is: 5%    Assessment & Plan:  Marland KitchenMarland KitchenTauriel was seen today for hypertension.  Diagnoses and all orders for this visit:  Primary hypertension -     losartan-hydrochlorothiazide (HYZAAR) 100-12.5 MG tablet; Take 1 tablet by mouth daily.  Visit for screening mammogram -     MM 3D SCREENING MAMMOGRAM  BILATERAL BREAST  Viral URI with cough -     benzonatate (TESSALON) 200 MG capsule; Take 1 capsule (200 mg total) by mouth 3 (three) times daily as needed.   BP not to goal Increased Hyzaar  Pt agrees to get CMP done today Declined lipid panel due to being self pay today Discussed limiting alcohol use and consider DASH diet Encouraged exercise 150 minutes a week Continue to monitor BP at home Nurse visit recheck in 2 weeks Tessalon pearls for cough Continue other symptomatic care for viral infections Follow up in 6 months or sooner if needed   Tandy Gaw, PA-C

## 2023-12-28 ENCOUNTER — Encounter: Payer: Self-pay | Admitting: Physician Assistant

## 2023-12-28 LAB — CMP14+EGFR
ALT: 27 [IU]/L (ref 0–32)
AST: 20 [IU]/L (ref 0–40)
Albumin: 4.6 g/dL (ref 3.8–4.9)
Alkaline Phosphatase: 62 [IU]/L (ref 44–121)
BUN/Creatinine Ratio: 21 (ref 9–23)
BUN: 16 mg/dL (ref 6–24)
Bilirubin Total: 0.4 mg/dL (ref 0.0–1.2)
CO2: 23 mmol/L (ref 20–29)
Calcium: 9.6 mg/dL (ref 8.7–10.2)
Chloride: 97 mmol/L (ref 96–106)
Creatinine, Ser: 0.76 mg/dL (ref 0.57–1.00)
Globulin, Total: 2.7 g/dL (ref 1.5–4.5)
Glucose: 88 mg/dL (ref 70–99)
Potassium: 4.2 mmol/L (ref 3.5–5.2)
Sodium: 138 mmol/L (ref 134–144)
Total Protein: 7.3 g/dL (ref 6.0–8.5)
eGFR: 91 mL/min/{1.73_m2} (ref >59)

## 2023-12-28 NOTE — Progress Notes (Signed)
Tonya Malone,   Kidney, liver, glucose look GREAT!

## 2024-03-22 ENCOUNTER — Other Ambulatory Visit: Payer: Self-pay | Admitting: Physician Assistant

## 2024-03-22 DIAGNOSIS — I10 Essential (primary) hypertension: Secondary | ICD-10-CM

## 2024-06-25 ENCOUNTER — Ambulatory Visit: Payer: Self-pay | Admitting: Physician Assistant

## 2024-06-25 ENCOUNTER — Encounter: Payer: Self-pay | Admitting: Physician Assistant

## 2024-06-25 ENCOUNTER — Other Ambulatory Visit: Payer: Self-pay | Admitting: Physician Assistant

## 2024-06-25 VITALS — BP 130/78 | HR 74 | Ht 66.0 in | Wt 174.0 lb

## 2024-06-25 DIAGNOSIS — I10 Essential (primary) hypertension: Secondary | ICD-10-CM

## 2024-06-25 DIAGNOSIS — F1090 Alcohol use, unspecified, uncomplicated: Secondary | ICD-10-CM

## 2024-06-25 DIAGNOSIS — B001 Herpesviral vesicular dermatitis: Secondary | ICD-10-CM

## 2024-06-25 MED ORDER — LOSARTAN POTASSIUM-HCTZ 100-12.5 MG PO TABS: 1.0000 | ORAL_TABLET | Freq: Every day | ORAL | 1 refills | Status: AC

## 2024-06-25 MED ORDER — VALACYCLOVIR HCL 500 MG PO TABS
500.0000 mg | ORAL_TABLET | Freq: Two times a day (BID) | ORAL | 3 refills | Status: AC
Start: 2024-06-25 — End: ?

## 2024-06-25 NOTE — Progress Notes (Signed)
   Established Patient Office Visit  Subjective   Patient ID: Tonya Malone, female    DOB: 08/15/1966  Age: 58 y.o. MRN: [id number]  Chief Complaint  Patient presents with   Medical Management of Chronic Issues    HPI Pt is a 58 yo female with HTN and recurrent cold sores that presents to the clinic for medication refills.   She is doing well. She does not check BP at home. She denies any CP, palpitations, headaches or vision changes. She is compliant with hyzaar. She admits to drinking alcohol most nights, wine. She does stay active and try to eat healthy.   Valtrex  does well at suppressing cold sores.   Pt does plan on getting insurance next year. Right now she is still self pay.   Review of Systems  All other systems reviewed and are negative.     Objective:     BP 130/78   Pulse 74   Ht 5' 6 (1.676 m)   Wt 174 lb (78.9 kg)   LMP  (LMP Unknown) Comment: 2 years ago  SpO2 98%   BMI 28.08 kg/m  BP Readings from Last 3 Encounters:  06/25/24 130/78  12/27/23 (!) 148/90  09/21/23 (!) 140/84   Wt Readings from Last 3 Encounters:  06/25/24 174 lb (78.9 kg)  12/27/23 170 lb (77.1 kg)  09/21/23 170 lb (77.1 kg)      Physical Exam Constitutional:      Appearance: Normal appearance.  HENT:     Head: Normocephalic.  Cardiovascular:     Rate and Rhythm: Normal rate and regular rhythm.     Heart sounds: Normal heart sounds. No murmur heard. Pulmonary:     Effort: Pulmonary effort is normal.     Breath sounds: Normal breath sounds.  Musculoskeletal:     Cervical back: Normal range of motion and neck supple.     Right lower leg: No edema.     Left lower leg: No edema.  Neurological:     Mental Status: She is alert.  Psychiatric:        Mood and Affect: Mood normal.        The 10-year ASCVD risk score (Arnett DK, et al., 2019) is: 3.8%    Assessment & Plan:  Tonya Malone was seen today for medical management of chronic issues.  Diagnoses and all orders  for this visit:  Primary hypertension -     losartan -hydrochlorothiazide  (HYZAAR) 100-12.5 MG tablet; Take 1 tablet by mouth daily. -     CMP14+EGFR  Recurrent cold sores -     valACYclovir  (VALTREX ) 500 MG tablet; Take 1 tablet (500 mg total) by mouth 2 (two) times daily.   BP under 140/90 Refilled hyzaar Cmp ordered today Valtrex  refilled for suppression therapy Pt declined all other health prevention and labs due to being self pay She plans on getting insurance next year Stay active and make sure taking vitamin D  daily Follow up in 6 months   Return in about 6 months (around 12/26/2024).    Tonya Daubert, PA-C

## 2024-06-26 ENCOUNTER — Ambulatory Visit: Payer: Self-pay | Admitting: Physician Assistant

## 2024-06-26 LAB — CMP14+EGFR
ALT: 20 IU/L (ref 0–32)
AST: 18 IU/L (ref 0–40)
Albumin: 4.5 g/dL (ref 3.8–4.9)
Alkaline Phosphatase: 59 IU/L (ref 44–121)
BUN/Creatinine Ratio: 16 (ref 9–23)
BUN: 14 mg/dL (ref 6–24)
Bilirubin Total: 0.3 mg/dL (ref 0.0–1.2)
CO2: 22 mmol/L (ref 20–29)
Calcium: 10.2 mg/dL (ref 8.7–10.2)
Chloride: 98 mmol/L (ref 96–106)
Creatinine, Ser: 0.87 mg/dL (ref 0.57–1.00)
Globulin, Total: 2.3 g/dL (ref 1.5–4.5)
Glucose: 92 mg/dL (ref 70–99)
Potassium: 3.9 mmol/L (ref 3.5–5.2)
Sodium: 138 mmol/L (ref 134–144)
Total Protein: 6.8 g/dL (ref 6.0–8.5)
eGFR: 78 mL/min/1.73 (ref >59)

## 2024-06-26 NOTE — Progress Notes (Signed)
Great labs.

## 2024-12-26 ENCOUNTER — Ambulatory Visit: Payer: Self-pay | Admitting: Physician Assistant
